# Patient Record
Sex: Female | Born: 1950 | Race: Black or African American | Hispanic: No | Smoking: Never smoker
Health system: Southern US, Community
[De-identification: ages and names within clinical notes are randomized; demographics above are authoritative.]

## PROBLEM LIST (undated history)

## (undated) DIAGNOSIS — E119 Type 2 diabetes mellitus without complications: Secondary | ICD-10-CM

## (undated) DIAGNOSIS — J329 Chronic sinusitis, unspecified: Secondary | ICD-10-CM

## (undated) DIAGNOSIS — E7800 Pure hypercholesterolemia, unspecified: Secondary | ICD-10-CM

## (undated) DIAGNOSIS — I10 Essential (primary) hypertension: Secondary | ICD-10-CM

## (undated) HISTORY — DX: Type 2 diabetes mellitus without complications: E11.9

## (undated) HISTORY — PX: JOINT REPLACEMENT: SHX530

---

## 1997-11-26 HISTORY — PX: ABDOMINAL HYSTERECTOMY: SHX81

## 2000-11-26 HISTORY — PX: SPINE SURGERY: SHX786

## 2007-06-20 DIAGNOSIS — Z96659 Presence of unspecified artificial knee joint: Secondary | ICD-10-CM | POA: Insufficient documentation

## 2012-02-05 LAB — HM COLONOSCOPY

## 2012-04-14 ENCOUNTER — Encounter (HOSPITAL_COMMUNITY): Payer: Self-pay | Admitting: Cardiology

## 2012-04-14 ENCOUNTER — Emergency Department (INDEPENDENT_AMBULATORY_CARE_PROVIDER_SITE_OTHER)
Admission: EM | Admit: 2012-04-14 | Discharge: 2012-04-14 | Disposition: A | Discharge status: Home / Self Care | Payer: Medicare Other | Source: Home / Self Care | Attending: Emergency Medicine | Admitting: Emergency Medicine

## 2012-04-14 DIAGNOSIS — R51 Headache: Secondary | ICD-10-CM

## 2012-04-14 DIAGNOSIS — J329 Chronic sinusitis, unspecified: Secondary | ICD-10-CM

## 2012-04-14 HISTORY — DX: Chronic sinusitis, unspecified: J32.9

## 2012-04-14 HISTORY — DX: Essential (primary) hypertension: I10

## 2012-04-14 HISTORY — DX: Pure hypercholesterolemia, unspecified: E78.00

## 2012-04-14 MED ORDER — GUAIFENESIN ER 600 MG PO TB12: 1200.0000 mg | ORAL_TABLET | Freq: Two times a day (BID) | ORAL | Status: AC

## 2012-04-14 MED ORDER — FLUTICASONE PROPIONATE 50 MCG/ACT NA SUSP: 2.0000 | Freq: Every day | NASAL | Status: DC

## 2012-04-14 MED ORDER — DOXYCYCLINE HYCLATE 100 MG PO CAPS: 100.0000 mg | ORAL_CAPSULE | Freq: Two times a day (BID) | ORAL | Status: AC

## 2012-04-14 MED ORDER — BUTALBITAL-APAP-CAFFEINE 50-325-40 MG PO TABS: 1.0000 | ORAL_TABLET | Freq: Four times a day (QID) | ORAL | Status: AC | PRN

## 2012-04-14 NOTE — Discharge Instructions (Signed)
Start using the Steinauer med sinus rinse, Neti pot, or another form of saline nasal irrigation to help clear your sinuses. You must drink extra fluids. Go to the ER if you do nto get better after being on the antibiotics for 48 hours, or immediately if your headache changes, gets worse, or any concerns.

## 2012-04-14 NOTE — ED Notes (Addendum)
Pt reports headache since May 4th with some easing off but now relief. Right eye redness for the past 3-4 months now has developed right eye pain. Pt has nasal buring and pressure. Denies fever. Pt states she blew nose the other day and she became "sick" (dizzy). Tolerating PO intake. Right hand tingling that started off/on since the headache started. Right leg also cramping and aching that is relieved somewhat by flexeril. Pt states when laying on right side there is a lot of pressure in her head and right eye. MAE without difficult.  Pt reports tiredness. Pt in town visiting and has a primary doctor in her home town Dr Kirtland Bouchard. Laural Benes

## 2012-04-14 NOTE — ED Provider Notes (Signed)
History     CSN: [id number]  Arrival date & time 04/14/12  4098   First MD Initiated Contact with Patient 04/14/12 1023      Chief Complaint  Patient presents with  . Headache  . Eye Pain  . Nose Problem    (Consider location/radiation/quality/duration/timing/severity/associated sxs/prior treatment) HPI Comments: Amy Mcgee is a 61 y.o. female who presents to Urgent Care today for gradual onset, intermittent, dull, throbbing, right-sided headache lasting hours starting "weeks ago". Reports a burning sensation in the sinuses, States that it feels that it is located "around my eye, "and she points to the frontal maxillary sinuses. She complains of intermittent periorbital swelling. Reports nasal congestion, but no nasal drainage. States it feels like "something is stuck in there."   States it started getting worse approximately 3 weeks ago. The headache is worse with bending forward and with lying down, especially on her right side. She has tried multiple OTC medications, and antihistamines without relief the. States BC powders seem to work best for her. denies foreign body insertion. No periorbital redness, eye pain, visual changes, pain with eye movement, photophobia. No nausea, vomiting, , phonophobia, fevers. no dental pain. No neck stiffness, rash. No dysarthria, discoordination, paresthesias. Patient has a history of chronic headaches, sinus infections, and has been evaluated by neurology and and ENT in the past. Per ENT, she has very narrow nasal passageways on the right side. She states that today's headache is similar to previous headaches, and is not worse than ones that she has had before. PMH includes hypertension, sinusitis. No history of strokes   ROS as noted in HPI. All other ROS negative.   Patient is a 61 y.o. female presenting with headaches. The history is provided by the patient. No language interpreter was used.  Headache The primary symptoms include headaches. The  symptoms began more than 1 week ago. The symptoms are unchanged. The neurological symptoms are focal.  The headache is not associated with photophobia, neck stiffness or weakness.  Additional symptoms do not include neck stiffness, weakness or photophobia. Medical issues also include hypertension. Medical issues do not include seizures, cerebral vascular accident, cancer, alcohol use, drug use or diabetes. Procedure history comments: neuro and ENT evaluation.    Past Medical History  Diagnosis Date  . Hypercholesterolemia   . Hypertension   . Sinusitis     Past Surgical History  Procedure Date  . Spine surgery 2002    cervical  . Joint replacement     right 2008 and left 2003  knee  . Abdominal hysterectomy 1999    History reviewed. No pertinent family history.  History  Substance Use Topics  . Smoking status: Never Smoker   . Smokeless tobacco: Not on file  . Alcohol Use: No    OB History    Grav Para Term Preterm Abortions TAB SAB Ect Mult Living                  Review of Systems  HENT: Negative for neck stiffness.   Eyes: Negative for photophobia.  Neurological: Positive for headaches. Negative for weakness.    Allergies  Lyrica  Home Medications   Current Outpatient Rx  Name Route Sig Dispense Refill  . CYCLOBENZAPRINE HCL 10 MG PO TABS Oral Take 10 mg by mouth 3 (three) times daily as needed.    . FUROSEMIDE 40 MG PO TABS Oral Take 40 mg by mouth as needed.    Marland Kitchen PRAVASTATIN SODIUM 40 MG PO TABS  Oral Take 40 mg by mouth daily.    Marland Kitchen SPIRONOLACTONE-HCTZ 25-25 MG PO TABS Oral Take 1 tablet by mouth daily.    Marland Kitchen BUTALBITAL-APAP-CAFFEINE 50-325-40 MG PO TABS Oral Take 1-2 tablets by mouth every 6 (six) hours as needed for headache. 20 tablet 0  . DIFLUNISAL 500 MG PO TABS Oral Take 500 mg by mouth 2 (two) times daily.    Marland Kitchen DOXYCYCLINE HYCLATE 100 MG PO CAPS Oral Take 1 capsule (100 mg total) by mouth 2 (two) times daily. X 7 days 14 capsule 0  . FLUTICASONE  PROPIONATE 50 MCG/ACT NA SUSP Nasal Place 2 sprays into the nose daily. 16 g 0  . GUAIFENESIN ER 600 MG PO TB12 Oral Take 2 tablets (1,200 mg total) by mouth 2 (two) times daily. 28 tablet 0    BP 143/93  Pulse 106  Temp(Src) 98.3 F (36.8 C) (Oral)  Resp 20  SpO2 96%  Physical Exam  Nursing note and vitals reviewed. Constitutional: She is oriented to person, place, and time. She appears well-developed and well-nourished. No distress.  HENT:  Head: Normocephalic and atraumatic. No trismus in the jaw.  Right Ear: Tympanic membrane normal.  Left Ear: Tympanic membrane normal.  Nose: Right sinus exhibits maxillary sinus tenderness and frontal sinus tenderness. Left sinus exhibits no maxillary sinus tenderness and no frontal sinus tenderness.  Mouth/Throat: Uvula is midline, oropharynx is clear and moist and mucous membranes are normal. No dental caries.       Purulent nasal drainage right side.   Eyes: Conjunctivae and EOM are normal. Pupils are equal, round, and reactive to light.  Fundoscopic exam:      The right eye shows no hemorrhage and no papilledema.       The left eye shows no hemorrhage and no papilledema.  Neck: Normal range of motion and full passive range of motion without pain. Neck supple. No Brudzinski's sign and no Kernig's sign noted.  Cardiovascular: Normal rate, regular rhythm and normal heart sounds.   Pulmonary/Chest: Effort normal and breath sounds normal.  Abdominal: Soft. Bowel sounds are normal. She exhibits no distension.  Musculoskeletal: Normal range of motion.  Lymphadenopathy:    She has no cervical adenopathy.  Neurological: She is alert and oriented to person, place, and time. She has normal Mcgee. She displays no tremor. No cranial nerve deficit or sensory deficit. She displays a negative Romberg sign. Coordination and gait normal.       Finger->nose, heel-> shin WNL. Tandem gait steady.   Skin: Skin is warm and dry.  Psychiatric: She has a normal  mood and affect. Her behavior is normal. Judgment and thought content normal.    ED Course  Procedures (including critical care time)  Labs Reviewed - No data to display No results found.   1. Sinus headache   2. Sinusitis     MDM  Pt describing typical pain, no sudden onset. Patient has a long history of similar headaches,Doubt SAH, ICH or space occupying lesion. Pt without fevers/chills, Pt has no meningeal sx, no nuchal rigidity. Doubt meningitis. Pt with normal neuro exam, no evidence of CVA/TIA.  Pt BP not elevated significantly, doubt hypertensive emergency. No evidence of temporal artery tenderness, no evidence of glaucoma or other occular pathology. . H&P most consistent with chronic sinusitis, sinus headache. Offered transfer to the ED CT of the head, but patient has opted to try outpatient treatment first. Will start her on doxycycline, Flonase, Mucinex, have her increase her fluid intake.  Also discontinue BC powders and will have her start Fioricet as needed for pain. Discussed signs and symptoms that should prompt return to the emergency department. Patient and  daughter agree.     Luiz Blare, MD 04/14/12 [phone]

## 2013-10-07 LAB — HM MAMMOGRAPHY

## 2014-02-01 ENCOUNTER — Telehealth: Payer: Self-pay

## 2014-02-01 NOTE — Telephone Encounter (Signed)
Unable to reach patient with number listed.  NEW PATIENT

## 2014-02-04 ENCOUNTER — Ambulatory Visit (INDEPENDENT_AMBULATORY_CARE_PROVIDER_SITE_OTHER): Payer: 59 | Admitting: Family Medicine

## 2014-02-04 ENCOUNTER — Encounter: Payer: Self-pay | Admitting: Family Medicine

## 2014-02-04 VITALS — BP 144/82 | HR 56 | Temp 98.2°F | Ht 62.0 in | Wt 299.8 lb

## 2014-02-04 DIAGNOSIS — E785 Hyperlipidemia, unspecified: Secondary | ICD-10-CM | POA: Insufficient documentation

## 2014-02-04 DIAGNOSIS — Z87898 Personal history of other specified conditions: Secondary | ICD-10-CM

## 2014-02-04 DIAGNOSIS — E669 Obesity, unspecified: Secondary | ICD-10-CM | POA: Insufficient documentation

## 2014-02-04 DIAGNOSIS — J45909 Unspecified asthma, uncomplicated: Secondary | ICD-10-CM

## 2014-02-04 DIAGNOSIS — F411 Generalized anxiety disorder: Secondary | ICD-10-CM

## 2014-02-04 DIAGNOSIS — R0609 Other forms of dyspnea: Secondary | ICD-10-CM

## 2014-02-04 DIAGNOSIS — R0989 Other specified symptoms and signs involving the circulatory and respiratory systems: Secondary | ICD-10-CM

## 2014-02-04 DIAGNOSIS — R0683 Snoring: Secondary | ICD-10-CM

## 2014-02-04 DIAGNOSIS — Z23 Encounter for immunization: Secondary | ICD-10-CM

## 2014-02-04 LAB — CBC WITH DIFFERENTIAL/PLATELET
BASOS PCT: 0.4 % (ref 0.0–3.0)
Basophils Absolute: 0 10*3/uL (ref 0.0–0.1)
EOS ABS: 0.1 10*3/uL (ref 0.0–0.7)
EOS PCT: 1.6 % (ref 0.0–5.0)
HCT: 40.9 % (ref 36.0–46.0)
HEMOGLOBIN: 13.4 g/dL (ref 12.0–15.0)
LYMPHS PCT: 41.8 % (ref 12.0–46.0)
Lymphs Abs: 1.8 10*3/uL (ref 0.7–4.0)
MCHC: 32.7 g/dL (ref 30.0–36.0)
MCV: 85.3 fl (ref 78.0–100.0)
Monocytes Absolute: 0.3 10*3/uL (ref 0.1–1.0)
Monocytes Relative: 7.6 % (ref 3.0–12.0)
NEUTROS ABS: 2.1 10*3/uL (ref 1.4–7.7)
Neutrophils Relative %: 48.6 % (ref 43.0–77.0)
Platelets: 217 10*3/uL (ref 150.0–400.0)
RBC: 4.79 Mil/uL (ref 3.87–5.11)
RDW: 14.5 % (ref 11.5–14.6)
WBC: 4.3 10*3/uL — AB (ref 4.5–10.5)

## 2014-02-04 LAB — BASIC METABOLIC PANEL
BUN: 10 mg/dL (ref 6–23)
CALCIUM: 9.6 mg/dL (ref 8.4–10.5)
CHLORIDE: 103 meq/L (ref 96–112)
CO2: 26 meq/L (ref 19–32)
Creatinine, Ser: 0.9 mg/dL (ref 0.4–1.2)
GFR: 84.53 mL/min (ref >60.00)
Glucose, Bld: 94 mg/dL (ref 70–99)
Potassium: 4.6 mEq/L (ref 3.5–5.1)
SODIUM: 138 meq/L (ref 135–145)

## 2014-02-04 LAB — TSH: TSH: 2.51 u[IU]/mL (ref 0.35–5.50)

## 2014-02-04 LAB — HEPATIC FUNCTION PANEL
ALT: 15 U/L (ref 0–35)
AST: 24 U/L (ref 0–37)
Albumin: 3.9 g/dL (ref 3.5–5.2)
Alkaline Phosphatase: 100 U/L (ref 39–117)
BILIRUBIN DIRECT: 0.2 mg/dL (ref 0.0–0.3)
BILIRUBIN TOTAL: 1.2 mg/dL (ref 0.3–1.2)
TOTAL PROTEIN: 7.4 g/dL (ref 6.0–8.3)

## 2014-02-04 LAB — SEDIMENTATION RATE: SED RATE: 31 mm/h — AB (ref 0–22)

## 2014-02-04 LAB — T4, FREE: Free T4: 0.92 ng/dL (ref 0.60–1.60)

## 2014-02-04 LAB — HEMOGLOBIN A1C: Hgb A1c MFr Bld: 7.2 % — ABNORMAL HIGH (ref 4.6–6.5)

## 2014-02-04 LAB — T3, FREE: T3 FREE: 2.4 pg/mL (ref 2.3–4.2)

## 2014-02-04 MED ORDER — ZOSTER VACCINE LIVE 19400 UNT/0.65ML ~~LOC~~ SOLR: 0.6500 mL | Freq: Once | SUBCUTANEOUS | Status: DC

## 2014-02-04 MED ORDER — CITALOPRAM HYDROBROMIDE 20 MG PO TABS: 20.0000 mg | ORAL_TABLET | Freq: Every day | ORAL | Status: DC

## 2014-02-04 MED ORDER — ALPRAZOLAM 0.25 MG PO TABS: 0.2500 mg | ORAL_TABLET | Freq: Three times a day (TID) | ORAL | Status: DC | PRN

## 2014-02-04 NOTE — Progress Notes (Signed)
Pre visit review using our clinic review tool, if applicable. No additional management support is needed unless otherwise documented below in the visit note. 

## 2014-02-04 NOTE — Progress Notes (Signed)
Patient ID: Amy Mcgee, female   DOB: [birth year], 63 y.o.   MRN: [id number]   Subjective:    Patient ID: Amy Mcgee, female    DOB: 1951-02-09, 63 y.o.   MRN: [id number] HPI Pt here with her daughter c/o swelling in face and sometimes difficulty swallowing as well as joint swelling / pains.  Pt is from [place] and has seen allergists, rheum and neuro there and no one has been able to figure out what is wrong.  Pt states it did start after a knee replacement several years ago.    Past Medical History  Diagnosis Date  . Hypercholesterolemia   . Hypertension   . Sinusitis    History reviewed. No pertinent family history. History   Social History  . Marital Status: Divorced    Spouse Name: N/A    Number of Children: N/A  . Years of Education: N/A   Occupational History  . Not on file.   Social History Main Topics  . Smoking status: Never Smoker   . Smokeless tobacco: Not on file  . Alcohol Use: No  . Drug Use: No  . Sexual Activity: No   Other Topics Concern  . Not on file   Social History Narrative   Lives in [place] but comes to [place] to live with her daughter for several weeks at a time   Current Outpatient Prescriptions on File Prior to Visit  Medication Sig Dispense Refill  . cyclobenzaprine (FLEXERIL) 10 MG tablet Take 10 mg by mouth 3 (three) times daily as needed.      . furosemide (LASIX) 40 MG tablet Take 40 mg by mouth as needed.      . pravastatin (PRAVACHOL) 40 MG tablet Take 40 mg by mouth daily.       No current facility-administered medications on file prior to visit.   Past Surgical History  Procedure Laterality Date  . Spine surgery  2002    cervical  . Joint replacement      right 2008 and left 2003  knee  . Abdominal hysterectomy  1999    .       Objective:    BP 144/82  Pulse 56  Temp(Src) 98.2 F (36.8 C) (Oral)  Ht _0  (1.575 m)  Wt 299 lb 12.8 oz (135.988 kg)  BMI 54.82 kg/m2  SpO2 98% General appearance: alert,  cooperative, appears stated age and no distress Eyes: conjunctivae/corneas clear. PERRL, EOM's intact. Fundi benign.-- eyelids swollen Ears: normal TM's and external ear canals both ears Nose: Nares normal. Septum midline. Mucosa normal. No drainage or sinus tenderness. Throat: lips, mucosa, and tongue normal; teeth and gums normal Neck: no adenopathy, supple, symmetrical, trachea midline and thyroid not enlarged, symmetric, no tenderness/mass/nodules Lungs: clear to auscultation bilaterally Heart: S1, S2 normal Extremities: extremities normal, atraumatic, no cyanosis or edema Lymph nodes: Cervical, supraclavicular, and axillary nodes normal. Neurologic: Alert and oriented X 3, normal strength and tone. Normal symmetric reflexes. Normal coordination and gait-- walks with cane        Assessment & Plan:  1. H/O angioedema ? Etiology-- ?HAE - Ambulatory referral to Pulmonology - Basic metabolic panel - CBC with Differential - Hemoglobin A1c - Hepatic function panel - POCT urinalysis dipstick - TSH - Vitamin D 1,25 dihydroxy - Antinuclear Antib (ANA) - Rheumatoid Factor - Sed Rate (ESR) - Aspergillus antibody (complement fix) - C4 complement - C1 Esterase Inhibitor Panel - Complement component c1q - T3, free - T4, free  2. Snoring ?  OSA - Ambulatory referral to Pulmonology - Basic metabolic panel - CBC with Differential - Hemoglobin A1c - Hepatic function panel - POCT urinalysis dipstick - TSH - Vitamin D 1,25 dihydroxy - Antinuclear Antib (ANA) - Rheumatoid Factor - Sed Rate (ESR)  3. Unspecified asthma(493.90) con't inhalers prn  4. Other and unspecified hyperlipidemia   5. Anxiety state, unspecified Start meds--rto 1 month - citalopram (CELEXA) 20 MG tablet; Take 1 tablet (20 mg total) by mouth daily.  Dispense: 30 tablet; Refill: 2  6. Need for shingles vaccine rx printed - zoster vaccine live, PF, (ZOSTAVAX) 14709 UNT/0.65ML injection; Inject 19,400  Units into the skin once.  Dispense: 1 each; Refill: 0

## 2014-02-04 NOTE — Patient Instructions (Signed)
Angioedema Angioedema is a sudden swelling of tissues, often of the skin. It can occur on the face or genitals or in the abdomen or other body parts. The swelling usually develops over a short period and gets better in 24 to 48 hours. It often begins during the night and is found when the person wakes up. The person may also get red, itchy patches of skin (hives). Angioedema can be dangerous if it involves swelling of the air passages.  Depending on the cause, episodes of angioedema may only happen once, come back in unpredictable patterns, or repeat for several years and then gradually fade away.  CAUSES  Angioedema can be caused by an allergic reaction to various triggers. It can also result from nonallergic causes, including reactions to drugs, immune system disorders, viral infections, or an abnormal gene that is passed to you from your parents (hereditary). For some people with angioedema, the cause is unknown.  Some things that can trigger angioedema include:   Foods.   Medicines, such as ACE inhibitors, ARBs, nonsteroidal anti-inflammatory agents, or estrogen.   Latex.   Animal saliva.   Insect stings.   Dyes used in X-rays.   Mild injury.   Dental work.  Surgery.  Stress.   Sudden changes in temperature.   Exercise. SIGNS AND SYMPTOMS   Swelling of the skin.  Hives. If these are present, there is also intense itching.  Redness in the affected area.   Pain in the affected area.  Swollen lips or tongue.  Breathing problems. This may happen if the air passages swell.  Wheezing. If internal organs are involved, there may be:   Nausea.   Abdominal pain.   Vomiting.   Difficulty swallowing.   Difficulty passing urine. DIAGNOSIS   Your health care provider will examine the affected area and take a medical and family history.  Various tests may be done to help determine the cause. Tests may include:  Allergy skin tests to see if the problem  is an allergic reaction.   Blood tests to check for hereditary angioedema.   Tests to check for underlying diseases that could cause the condition.   A review of your medicines, including over the counter medicines, may be done. TREATMENT  Treatment will depend on the cause of the angioedema. Possible treatments include:   Removal of anything that triggered the condition (such as stopping certain medicines).   Medicines to treat symptoms or prevent attacks. Medicines given may include:   Antihistamines.   Epinephrine injection.   Steroids.   Hospitalization may be required for severe attacks. If the air passages are affected, it can be an emergency. Tubes may need to be placed to keep the airway open. HOME CARE INSTRUCTIONS   Only take over-the-counter or prescription medicines as directed by your health care provider.  If you were given medicines for emergency allergy treatment, always carry them with you.  Wear a medical bracelet as directed by your health care provider.   Avoid known triggers. SEEK MEDICAL CARE IF:   You have repeat attacks of angioedema.   Your attacks are more frequent or more severe despite preventive measures.   You have hereditary angioedema and are considering having children. It is important to discuss the risks of passing the condition on to your children with your health care provider. SEEK IMMEDIATE MEDICAL CARE IF:   You have severe swelling of the mouth, tongue, or lips.  You have difficulty breathing.   You have difficulty swallowing.     You faint. MAKE SURE YOU:  Understand these instructions.  Will watch your condition.  Will get help right away if you are not doing well or get worse. Document Released: 01/21/2002 Document Revised: 09/02/2013 Document Reviewed: 07/06/2013 Exit[employer] Patient Information 2014 [employer].  

## 2014-02-05 LAB — POCT URINALYSIS DIPSTICK
Bilirubin, UA: NEGATIVE
Glucose, UA: NEGATIVE
KETONES UA: NEGATIVE
Leukocytes, UA: NEGATIVE
Nitrite, UA: NEGATIVE
PH UA: 6
PROTEIN UA: NEGATIVE
RBC UA: NEGATIVE
UROBILINOGEN UA: 0.2

## 2014-02-05 LAB — C4 COMPLEMENT: C4 COMPLEMENT: 33 mg/dL (ref 10–40)

## 2014-02-05 LAB — ANA: ANA: NEGATIVE

## 2014-02-05 LAB — RHEUMATOID FACTOR

## 2014-02-05 MED ORDER — METFORMIN HCL 500 MG PO TABS: 500.0000 mg | ORAL_TABLET | Freq: Two times a day (BID) | ORAL | Status: DC

## 2014-02-06 LAB — C1 ESTERASE INHIBITOR: C1INH SerPl-mCnc: 27 mg/dL (ref 21–39)

## 2014-02-10 LAB — ASPERGILLUS ANTIBODY (COMPLEMENT FIX)

## 2014-02-10 NOTE — Telephone Encounter (Signed)
Unable to reach patient pre visit.  

## 2014-02-11 ENCOUNTER — Ambulatory Visit: Payer: 59

## 2014-02-11 DIAGNOSIS — E119 Type 2 diabetes mellitus without complications: Secondary | ICD-10-CM

## 2014-02-11 DIAGNOSIS — Z7189 Other specified counseling: Secondary | ICD-10-CM

## 2014-02-11 MED ORDER — ACCU-CHEK FASTCLIX LANCETS MISC: Status: DC

## 2014-02-11 MED ORDER — ACCU-CHEK AVIVA DEVI: Status: AC

## 2014-02-11 MED ORDER — GLUCOSE BLOOD VI STRP: ORAL_STRIP | Status: DC

## 2014-02-11 NOTE — Progress Notes (Unsigned)
Diabetes education was provided.  Daughter was present during the visit.  The following topics were reviewed:  Medication management (Metformin 500 mg twice daily with meals), blood glucose monitoring (how to monitor blood sugars and the green, yellow, red zones), signs and symptoms of hyperglycemia, signs and symptoms of hypoglycemia, healthy eating (how to read food labels, portion sizes, salt intake and carb counting), and healthy exercise options (walking, bicycling, water aerobics).  Over the next month, the patient stated that she wanted to work on improving her diet and exercise.  Healthy eating materials were provided to the patient.  Patient was also encouraged to increase her exercise by walking or doing water aerobics due to the problems with her knees. Daughter and mother discussed options that are available to patient in [place], [place].  The teach back method was used throughout the education session to verify patient's understanding of the topic   Prescription for accu-chek device, strips, and lancets were [facility] in [place].  Patient made aware.

## 2014-02-12 LAB — COMPLEMENT COMPONENT C1Q: Complement C1Q: 7.5 mg/dL (ref 5.0–8.6)

## 2014-02-13 LAB — VITAMIN D 1,25 DIHYDROXY
VITAMIN D2 1, 25 (OH): 20 pg/mL
Vitamin D 1, 25 (OH)2 Total: 32 pg/mL (ref 18–72)
Vitamin D3 1, 25 (OH)2: 12 pg/mL

## 2014-02-16 ENCOUNTER — Other Ambulatory Visit: Payer: Self-pay

## 2014-02-16 MED ORDER — VITAMIN D (ERGOCALCIFEROL) 1.25 MG (50000 UNIT) PO CAPS: 50000.0000 [IU] | ORAL_CAPSULE | ORAL | Status: DC

## 2014-03-09 ENCOUNTER — Encounter: Payer: Self-pay | Admitting: Internal Medicine

## 2014-03-09 ENCOUNTER — Ambulatory Visit (INDEPENDENT_AMBULATORY_CARE_PROVIDER_SITE_OTHER): Payer: Medicare Other | Admitting: Internal Medicine

## 2014-03-09 ENCOUNTER — Other Ambulatory Visit: Payer: Medicare Other

## 2014-03-09 ENCOUNTER — Ambulatory Visit (INDEPENDENT_AMBULATORY_CARE_PROVIDER_SITE_OTHER): Payer: Medicare Other | Admitting: Family Medicine

## 2014-03-09 ENCOUNTER — Encounter: Payer: Self-pay | Admitting: Family Medicine

## 2014-03-09 VITALS — BP 114/82 | HR 65 | Temp 98.1°F | Wt 295.4 lb

## 2014-03-09 VITALS — BP 122/70 | HR 74 | Ht 62.0 in | Wt 298.0 lb

## 2014-03-09 DIAGNOSIS — J310 Chronic rhinitis: Secondary | ICD-10-CM

## 2014-03-09 DIAGNOSIS — R600 Localized edema: Secondary | ICD-10-CM

## 2014-03-09 DIAGNOSIS — E1151 Type 2 diabetes mellitus with diabetic peripheral angiopathy without gangrene: Secondary | ICD-10-CM

## 2014-03-09 DIAGNOSIS — I10 Essential (primary) hypertension: Secondary | ICD-10-CM

## 2014-03-09 DIAGNOSIS — J309 Allergic rhinitis, unspecified: Secondary | ICD-10-CM

## 2014-03-09 DIAGNOSIS — R0902 Hypoxemia: Secondary | ICD-10-CM

## 2014-03-09 DIAGNOSIS — E1159 Type 2 diabetes mellitus with other circulatory complications: Secondary | ICD-10-CM

## 2014-03-09 DIAGNOSIS — R609 Edema, unspecified: Secondary | ICD-10-CM

## 2014-03-09 DIAGNOSIS — F411 Generalized anxiety disorder: Secondary | ICD-10-CM

## 2014-03-09 DIAGNOSIS — E785 Hyperlipidemia, unspecified: Secondary | ICD-10-CM

## 2014-03-09 DIAGNOSIS — J45909 Unspecified asthma, uncomplicated: Secondary | ICD-10-CM

## 2014-03-09 DIAGNOSIS — G4733 Obstructive sleep apnea (adult) (pediatric): Secondary | ICD-10-CM

## 2014-03-09 DIAGNOSIS — I798 Other disorders of arteries, arterioles and capillaries in diseases classified elsewhere: Secondary | ICD-10-CM

## 2014-03-09 MED ORDER — ALBUTEROL SULFATE HFA 108 (90 BASE) MCG/ACT IN AERS: 2.0000 | INHALATION_SPRAY | Freq: Four times a day (QID) | RESPIRATORY_TRACT | Status: DC | PRN

## 2014-03-09 MED ORDER — SPIRONOLACTONE 50 MG PO TABS: 50.0000 mg | ORAL_TABLET | Freq: Every day | ORAL | Status: DC

## 2014-03-09 MED ORDER — CITALOPRAM HYDROBROMIDE 40 MG PO TABS: 40.0000 mg | ORAL_TABLET | Freq: Every day | ORAL | Status: DC

## 2014-03-09 MED ORDER — PRAVASTATIN SODIUM 40 MG PO TABS: 40.0000 mg | ORAL_TABLET | Freq: Every day | ORAL | Status: DC

## 2014-03-09 MED ORDER — METFORMIN HCL 500 MG PO TABS: 500.0000 mg | ORAL_TABLET | Freq: Two times a day (BID) | ORAL | Status: DC

## 2014-03-09 MED ORDER — AMLODIPINE BESYLATE 5 MG PO TABS: 5.0000 mg | ORAL_TABLET | Freq: Every day | ORAL | Status: DC

## 2014-03-09 NOTE — Progress Notes (Signed)
Pre visit review using our clinic review tool, if applicable. No additional management support is needed unless otherwise documented below in the visit note. 

## 2014-03-09 NOTE — Progress Notes (Signed)
03/09/14- [age] yoF never smoker Dr Laury AxonLowne- concerned about sleep apnea and facial swelling. NPSG 05/11/12-[facility]-  Mild obstructive sleep apnea, AHI 14.9 per hour with body weight 288 pounds.PLMI 5.3/ hr. CPAP titration 06/03/12 to 11. She reports snoring for years. Sleeps in a recliner. Bedtime between 1 AM and 3 AM "could stay up all night", getting up 4 times before up in the morning between 10 AM and 11 AM. Says she has had 3 sleep studies but "never needed CPAP".  Lying down, feels stuffy in the nose. For 7 years has been experiencing periorbital edema. Had facial rash with negative workup for lupus. Gets sinus congestion treated with nose sprays, daily frontal headaches, joint pains and muscle spasms. Skin testing a year ago elsewhere reported negative. No ENT surgery. Facial swelling but better on a prednisone taper. Denies tongue swelling, hives or asthma. Labs 02/04/2014-sedimentation rate 31, ANA negative, rheumatoid factor normal, C4 complement and C1esterace negative. Divorced, living alone.  Prior to Admission medications   Medication Sig Start Date End Date Taking? Authorizing Provider  ACCU-CHEK FASTCLIX LANCETS MISC Check blood sugar once a day 02/11/14   Lelon PerlaYvonne R Lowne, DO  albuterol (PROAIR HFA) 108 (90 BASE) MCG/ACT inhaler Inhale 2 puffs into the lungs every 6 (six) hours as needed for wheezing or shortness of breath. 03/09/14   Lelon PerlaYvonne R Lowne, DO  ALPRAZolam (XANAX) 0.25 MG tablet Take 1 tablet (0.25 mg total) by mouth 3 (three) times daily as needed for anxiety or sleep. 02/04/14   Grayling CongressYvonne R Lowne, DO  amLODipine (NORVASC) 5 MG tablet Take 1 tablet (5 mg total) by mouth daily. 03/09/14   Lelon PerlaYvonne R Lowne, DO  Blood Glucose Monitoring Suppl (ACCU-CHEK AVIVA) device Use as instructed 02/11/14 02/11/15  Lelon PerlaYvonne R Lowne, DO  cetirizine (ZYRTEC) 10 MG tablet Take 10 mg by mouth daily.    Historical Provider, MD  cholecalciferol (VITAMIN D) 1000 UNITS tablet Take 1,000 Units by mouth daily.     Historical Provider, MD  citalopram (CELEXA) 40 MG tablet Take 1 tablet (40 mg total) by mouth daily. 03/09/14   Lelon PerlaYvonne R Lowne, DO  cyclobenzaprine (FLEXERIL) 10 MG tablet Take 10 mg by mouth 3 (three) times daily as needed.    Historical Provider, MD  furosemide (LASIX) 40 MG tablet Take 40 mg by mouth as needed.    Historical Provider, MD  glucose blood (ACCU-CHEK AVIVA PLUS) test strip Use as instructed 02/11/14   Lelon PerlaYvonne R Lowne, DO  ipratropium-albuterol (DUONEB) 0.5-2.5 (3) MG/3ML SOLN Take 3 mLs by nebulization every 6 (six) hours as needed.    Historical Provider, MD  metFORMIN (GLUCOPHAGE) 500 MG tablet Take 1 tablet (500 mg total) by mouth 2 (two) times daily with a meal. 03/09/14   Lelon PerlaYvonne R Lowne, DO  potassium chloride SA (K-DUR,KLOR-CON) 20 MEQ tablet Take 40 mEq by mouth once.    Historical Provider, MD  pravastatin (PRAVACHOL) 40 MG tablet Take 1 tablet (40 mg total) by mouth daily. 03/09/14   Lelon PerlaYvonne R Lowne, DO  spironolactone (ALDACTONE) 50 MG tablet Take 1 tablet (50 mg total) by mouth daily. 03/09/14   Lelon PerlaYvonne R Lowne, DO  Vitamin D, Ergocalciferol, (DRISDOL) 50000 UNITS CAPS capsule Take 1 capsule (50,000 Units total) by mouth every 7 (seven) days. 02/16/14   Lelon PerlaYvonne R Lowne, DO   Past Medical History  Diagnosis Date  . Hypercholesterolemia   . Hypertension   . Sinusitis    Past Surgical History  Procedure Laterality Date  .  Spine surgery  2002    cervical  . Joint replacement      right 2008 and left 2003  knee  . Abdominal hysterectomy  1999   Family History  Problem Relation Age of Onset  . Allergies Mother   . Allergies Maternal Aunt   . Allergies Brother   . Allergies Brother   . Allergies Daughter   . Stroke Mother    History   Social History  . Marital Status: Divorced    Spouse Name: N/A    Number of Children: N/A  . Years of Education: N/A   Occupational History  . retired    Social History Main Topics  . Smoking status: Never Smoker   . Smokeless  tobacco: Not on file  . Alcohol Use: No  . Drug Use: No  . Sexual Activity: No   Other Topics Concern  . Not on file   Social History Narrative   Lives in [place] but comes to [place] to live with her daughter for several weeks at a time   ROS-see HPI Constitutional:   No-   weight loss, night sweats, fevers, chills, fatigue, lassitude. HEENT:   + headaches, no-difficulty swallowing, tooth/dental problems, sore throat,       No-  sneezing, itching, ear ache, +nasal congestion, post nasal drip,  CV:  No-   chest pain, orthopnea, PND, swelling in lower extremities, anasarca,                                  dizziness, palpitations Resp: No-   shortness of breath with exertion or at rest.              No-   productive cough,  No non-productive cough,  No- coughing up of blood.              No-   change in color of mucus.  No- wheezing.   Skin: No-   rash or lesions. GI:  No-   heartburn, indigestion, abdominal pain, nausea, vomiting, diarrhea,                 change in bowel habits, loss of appetite GU: No-   dysuria, change in color of urine, no urgency or frequency.  No- flank pain. MS:  No-   joint pain or swelling.  No- decreased range of motion.  No- back pain. Neuro-     nothing unusual Psych:  No- change in mood or affect. No depression or anxiety.  No memory loss.  OBJ- Physical Exam General- Alert, Oriented, Affect-appropriate, Distress- none acute,+ overweight Skin- rash-none, lesions- none, excoriation- none Lymphadenopathy- none Head- atraumatic            Eyes- Gross vision intact, PERRLA, conjunctivae and secretions clear, + periorbital                                edema            Ears- Hearing, canals-normal            Nose- Clear, no-Septal dev, mucus, polyps, erosion, perforation             Throat- Mallampati II , mucosa clear , drainage- none, tonsils+ present Neck- flexible , trachea midline, no stridor , thyroid nl, carotid no bruit Chest - symmetrical  excursion , unlabored  Heart/CV- RRR , no murmur , no gallop  , no rub, nl s1 s2                           - JVD- none , edema- none, stasis changes- none, varices- none           Lung- clear to P&A, wheeze- none, cough- none , dullness-none, rub- none           Chest wall-  Abd- tender-no, distended-no, bowel sounds-present, HSM- no Br/ Gen/ Rectal- Not done, not indicated Extrem- cyanosis- none, clubbing, none, atrophy- none, strength- nl Neuro- grossly intact to observation

## 2014-03-09 NOTE — Patient Instructions (Signed)

## 2014-03-09 NOTE — Patient Instructions (Addendum)
Order- CT max fac, with/ without contrast  Dx Chronic facial edema, question sinusitis  Order- DME  ONOX room air     Dx hypoxia  Order- lab- Allergy profile    Dx allergic rhinitis

## 2014-03-09 NOTE — Progress Notes (Signed)
Subjective:    Patient ID: Amy Mcgee, female    DOB: 1951-02-27, 63 y.o.   MRN: [id number]  HPI     Review of Systems    as above Objective:   Physical Exam      Assessment & Plan:   Subjective:    Amy Mcgee is a 63 y.o. female who presents for Medicare Annual wellness   HPI HYPERTENSION  Blood pressure range-good  Chest pain- no      Dyspnea- no Lightheadedness- no   Edema- no change Other side effects - no   Medication compliance: good Low salt diet- yes  DIABETES  Blood Sugar ranges-good  Polyuria- no New Visual problems- no Hypoglycemic symptoms- no Other side effects-no Medication compliance - good Last eye exam- due Foot exam- today  HYPERLIPIDEMIA  Medication compliance- good RUQ pain- no  Muscle aches- no Other side effects-no       Preventive Screening-Counseling & Management  Tobacco History  Smoking status  . Never Smoker   Smokeless tobacco  . Not on file     Problems Prior to Visit 1. Inc anxiety  Current Problems (verified) Patient Active Problem List   Diagnosis Date Noted  . Unspecified asthma(493.90) 02/04/2014  . Other and unspecified hyperlipidemia 02/04/2014  . Obesity (BMI 30-39.9) 02/04/2014    Medications Prior to Visit Current Outpatient Prescriptions on File Prior to Visit  Medication Sig Dispense Refill  . ACCU-CHEK FASTCLIX LANCETS MISC Check blood sugar once a day  102 each  12  . ALPRAZolam (XANAX) 0.25 MG tablet Take 1 tablet (0.25 mg total) by mouth 3 (three) times daily as needed for anxiety or sleep.  30 tablet  1  . Blood Glucose Monitoring Suppl (ACCU-CHEK AVIVA) device Use as instructed  1 each  0  . cetirizine (ZYRTEC) 10 MG tablet Take 10 mg by mouth daily.      . cholecalciferol (VITAMIN D) 1000 UNITS tablet Take 1,000 Units by mouth daily.      . cyclobenzaprine (FLEXERIL) 10 MG tablet Take 10 mg by mouth 3 (three) times daily as needed.      . furosemide (LASIX) 40 MG tablet Take 40  mg by mouth as needed.      Marland Kitchen glucose blood (ACCU-CHEK AVIVA PLUS) test strip Use as instructed  100 each  12  . ipratropium-albuterol (DUONEB) 0.5-2.5 (3) MG/3ML SOLN Take 3 mLs by nebulization every 6 (six) hours as needed.      . potassium chloride SA (K-DUR,KLOR-CON) 20 MEQ tablet Take 40 mEq by mouth once.      . Vitamin D, Ergocalciferol, (DRISDOL) 50000 UNITS CAPS capsule Take 1 capsule (50,000 Units total) by mouth every 7 (seven) days.  4 capsule  2   No current facility-administered medications on file prior to visit.    Current Medications (verified) Current Outpatient Prescriptions  Medication Sig Dispense Refill  . ACCU-CHEK FASTCLIX LANCETS MISC Check blood sugar once a day  102 each  12  . ALPRAZolam (XANAX) 0.25 MG tablet Take 1 tablet (0.25 mg total) by mouth 3 (three) times daily as needed for anxiety or sleep.  30 tablet  1  . amLODipine (NORVASC) 5 MG tablet Take 1 tablet (5 mg total) by mouth daily.  90 tablet  1  . Blood Glucose Monitoring Suppl (ACCU-CHEK AVIVA) device Use as instructed  1 each  0  . cetirizine (ZYRTEC) 10 MG tablet Take 10 mg by mouth daily.      . cholecalciferol (  VITAMIN D) 1000 UNITS tablet Take 1,000 Units by mouth daily.      . cyclobenzaprine (FLEXERIL) 10 MG tablet Take 10 mg by mouth 3 (three) times daily as needed.      . furosemide (LASIX) 40 MG tablet Take 40 mg by mouth as needed.      Marland Kitchen. glucose blood (ACCU-CHEK AVIVA PLUS) test strip Use as instructed  100 each  12  . ipratropium-albuterol (DUONEB) 0.5-2.5 (3) MG/3ML SOLN Take 3 mLs by nebulization every 6 (six) hours as needed.      . metFORMIN (GLUCOPHAGE) 500 MG tablet Take 1 tablet (500 mg total) by mouth 2 (two) times daily with a meal.  180 tablet  1  . potassium chloride SA (K-DUR,KLOR-CON) 20 MEQ tablet Take 40 mEq by mouth once.      . pravastatin (PRAVACHOL) 40 MG tablet Take 1 tablet (40 mg total) by mouth daily.  90 tablet  1  . spironolactone (ALDACTONE) 50 MG tablet Take 1  tablet (50 mg total) by mouth daily.  90 tablet  1  . Vitamin D, Ergocalciferol, (DRISDOL) 50000 UNITS CAPS capsule Take 1 capsule (50,000 Units total) by mouth every 7 (seven) days.  4 capsule  2  . albuterol (PROAIR HFA) 108 (90 BASE) MCG/ACT inhaler Inhale 2 puffs into the lungs every 6 (six) hours as needed for wheezing or shortness of breath.  1 Inhaler  3  . citalopram (CELEXA) 40 MG tablet Take 1 tablet (40 mg total) by mouth daily.  90 tablet  1   No current facility-administered medications for this visit.     Allergies (verified) Lyrica   PAST HISTORY  Family History No family history on file.  Social History History  Substance Use Topics  . Smoking status: Never Smoker   . Smokeless tobacco: Not on file  . Alcohol Use: No     Are there smokers in your home (other than you)? No  Risk Factors Current exercise habits: The patient does not participate in regular exercise at present.  Dietary issues discussed: diabetic diet   Cardiac risk factors: diabetes mellitus, dyslipidemia, hypertension, obesity (BMI >= 30 kg/m2) and sedentary lifestyle.  Depression Screen (Note: if answer to either of the following is "Yes", a more complete depression screening is indicated)   Over the past two weeks, have you felt down, depressed or hopeless? No  Over the past two weeks, have you felt little interest or pleasure in doing things? No  Have you lost interest or pleasure in daily life? No  Do you often feel hopeless? No  Do you cry easily over simple problems? No  Activities of Daily Living In your present state of health, do you have any difficulty performing the following activities?:  Driving? No Managing money?  No Feeding yourself? No Getting from bed to chair? No Climbing a flight of stairs? No Preparing food and eating?: No Bathing or showering? No Getting dressed: No Getting to the toilet? No Using the toilet:No Moving around from place to place: No In the past  year have you fallen or had a near fall?:Yes--- pt didn't see something on floor in [employer]   Are you sexually active?  No  Do you have more than one partner?  No  Hearing Difficulties: No Do you often ask people to speak up or repeat themselves? No Do you experience ringing or noises in your ears? No Do you have difficulty understanding soft or whispered voices? No   Do you  feel that you have a problem with memory? No  Do you often misplace items? No  Do you feel safe at home?  Yes  Cognitive Testing  Alert? Yes  Normal Appearance?Yes  Oriented to person? Yes  Place? Yes   Time? Yes  Recall of three objects?  Yes  Can perform simple calculations? Yes  Displays appropriate judgment?Yes  Can read the correct time from a watch face?Yes   Advanced Directives have been discussed with the patient? Yes  List the Names of Other Physician/Practitioners you currently use: 1.  Young--allergy  Indicate any recent Medical Services you may have received from other than [facility] providers in the past year (date may be approximate).  Immunization History  Administered Date(s) Administered  . Zoster 02/04/2014    Screening Tests Health Maintenance  Topic Date Due  . Pap Smear  12/05/1968  . Tetanus/tdap  12/05/1969  . Influenza Vaccine  06/26/2014  . Mammogram  10/08/2015  . Colonoscopy  02/04/2022  . Zostavax  Completed    All answers were reviewed with the patient and necessary referrals were made:  Amy FreudYvonne Lowne, DO   03/09/2014   History reviewed:  She  has a past medical history of Hypercholesterolemia; Hypertension; and Sinusitis. She  does not have any pertinent problems on file. She  has past surgical history that includes Spine surgery (2002); Joint replacement; and Abdominal hysterectomy (1999). Her family history is not on file. She  reports that she has never smoked. She does not have any smokeless tobacco history on file. She reports that she does not drink alcohol or  use illicit drugs. She has a current medication list which includes the following prescription(s): accu-chek fastclix lancets, alprazolam, amlodipine, accu-chek aviva, cetirizine, cholecalciferol, cyclobenzaprine, furosemide, glucose blood, ipratropium-albuterol, metformin, potassium chloride sa, pravastatin, spironolactone, vitamin d (ergocalciferol), albuterol, and citalopram. Current Outpatient Prescriptions on File Prior to Visit  Medication Sig Dispense Refill  . ACCU-CHEK FASTCLIX LANCETS MISC Check blood sugar once a day  102 each  12  . ALPRAZolam (XANAX) 0.25 MG tablet Take 1 tablet (0.25 mg total) by mouth 3 (three) times daily as needed for anxiety or sleep.  30 tablet  1  . Blood Glucose Monitoring Suppl (ACCU-CHEK AVIVA) device Use as instructed  1 each  0  . cetirizine (ZYRTEC) 10 MG tablet Take 10 mg by mouth daily.      . cholecalciferol (VITAMIN D) 1000 UNITS tablet Take 1,000 Units by mouth daily.      . cyclobenzaprine (FLEXERIL) 10 MG tablet Take 10 mg by mouth 3 (three) times daily as needed.      . furosemide (LASIX) 40 MG tablet Take 40 mg by mouth as needed.      Marland Kitchen. glucose blood (ACCU-CHEK AVIVA PLUS) test strip Use as instructed  100 each  12  . ipratropium-albuterol (DUONEB) 0.5-2.5 (3) MG/3ML SOLN Take 3 mLs by nebulization every 6 (six) hours as needed.      . potassium chloride SA (K-DUR,KLOR-CON) 20 MEQ tablet Take 40 mEq by mouth once.      . Vitamin D, Ergocalciferol, (DRISDOL) 50000 UNITS CAPS capsule Take 1 capsule (50,000 Units total) by mouth every 7 (seven) days.  4 capsule  2   No current facility-administered medications on file prior to visit.   She is allergic to lyrica.  Review of Systems Pertinent items are noted in HPI.    Objective:     Vision by Snellen chart: opth  Body mass index is 54.02  kg/(m^2). BP 114/82  Pulse 65  Temp(Src) 98.1 F (36.7 C) (Oral)  Wt 295 lb 6.4 oz (133.993 kg)  SpO2 97%  BP 114/82  Pulse 65  Temp(Src) 98.1 F  (36.7 C) (Oral)  Wt 295 lb 6.4 oz (133.993 kg)  SpO2 97% General appearance: alert, cooperative, appears stated age and no distress Throat: lips, mucosa, and tongue normal; teeth and gums normal Neck: no adenopathy, supple, symmetrical, trachea midline and thyroid not enlarged, symmetric, no tenderness/mass/nodules Lungs: clear to auscultation bilaterally Abdomen: soft, non-tender; bowel sounds normal; no masses,  no organomegaly Extremities: extremities normal, atraumatic, no cyanosis or edema   Sensory exam of the foot is normal, tested with the monofilament. Good pulses, no lesions or ulcers, good peripheral pulses.   Assessment:    medicare wellness      Plan:     During the course of the visit the patient was educated and counseled about appropriate screening and preventive services including:    Pneumococcal vaccine   Screening mammography  Bone densitometry screening  Diabetes screening  Glaucoma screening  Advanced directives: has NO advanced directive  - add't info requested. Referral to SW: no  Diet review for nutrition referral? Yes ____  Not Indicated ___x_   Patient Instructions (the written plan) was given to the patient.  Medicare Attestation I have personally reviewed: The patient's medical and social history Their use of alcohol, tobacco or illicit drugs Their current medications and supplements The patient's functional ability including ADLs,fall risks, home safety risks, cognitive, and hearing and visual impairment Diet and physical activities Evidence for depression or mood disorders  The patient's weight, height, BMI, and visual acuity have been recorded in the chart.  I have made referrals, counseling, and provided education to the patient based on review of the above and I have provided the patient with a written personalized care plan for preventive services.    1. Unspecified asthma(493.90)  - albuterol (PROAIR HFA) 108 (90 BASE) MCG/ACT  inhaler; Inhale 2 puffs into the lungs every 6 (six) hours as needed for wheezing or shortness of breath.  Dispense: 1 Inhaler; Refill: 3  2. HTN (hypertension) stable - spironolactone (ALDACTONE) 50 MG tablet; Take 1 tablet (50 mg total) by mouth daily.  Dispense: 90 tablet; Refill: 1 - amLODipine (NORVASC) 5 MG tablet; Take 1 tablet (5 mg total) by mouth daily.  Dispense: 90 tablet; Refill: 1  3. DM (diabetes mellitus) type II controlled peripheral vascular disorder Stable-- con't meds - metFORMIN (GLUCOPHAGE) 500 MG tablet; Take 1 tablet (500 mg total) by mouth 2 (two) times daily with a meal.  Dispense: 180 tablet; Refill: 1  4. Other and unspecified hyperlipidemia  - pravastatin (PRAVACHOL) 40 MG tablet; Take 1 tablet (40 mg total) by mouth daily.  Dispense: 90 tablet; Refill: 1  5. Generalized anxiety disorder Inc celexa to 40 mg ---f/u June - citalopram (CELEXA) 40 MG tablet; Take 1 tablet (40 mg total) by mouth daily.  Dispense: 90 tablet; Refill: 1   Amy Freud, DO   03/09/2014

## 2014-03-10 LAB — ALLERGY FULL PROFILE
Alternaria Alternata: <0.1 kU/L
BOX ELDER: 0.77 kU/L — AB
Bahia Grass: <0.1 kU/L
Bermuda Grass: <0.1 kU/L
Candida Albicans: <0.1 kU/L
Cat Dander: <0.1 kU/L
Curvularia lunata: <0.1 kU/L
Dog Dander: <0.1 kU/L
Fescue: <0.1 kU/L
G005 Rye, Perennial: <0.1 kU/L
Goldenrod: <0.1 kU/L
HOUSE DUST HOLLISTER: 0.1 kU/L — AB
Helminthosporium halodes: <0.1 kU/L
IGE (IMMUNOGLOBULIN E), SERUM: 863.9 [IU]/mL — AB (ref 0.0–180.0)
Lamb's Quarters: <0.1 kU/L
Stemphylium Botryosum: <0.1 kU/L
Sycamore Tree: <0.1 kU/L
Timothy Grass: <0.1 kU/L

## 2014-03-12 ENCOUNTER — Ambulatory Visit (INDEPENDENT_AMBULATORY_CARE_PROVIDER_SITE_OTHER)
Admission: RE | Admit: 2014-03-12 | Discharge: 2014-03-12 | Disposition: A | Discharge status: Home / Self Care | Payer: Medicare Other | Source: Ambulatory Visit | Attending: Internal Medicine | Admitting: Internal Medicine

## 2014-03-12 ENCOUNTER — Telehealth: Payer: Self-pay | Admitting: Internal Medicine

## 2014-03-12 DIAGNOSIS — R600 Localized edema: Secondary | ICD-10-CM

## 2014-03-12 DIAGNOSIS — R0902 Hypoxemia: Secondary | ICD-10-CM

## 2014-03-12 DIAGNOSIS — R609 Edema, unspecified: Secondary | ICD-10-CM

## 2014-03-12 MED ORDER — IOHEXOL 300 MG/ML  SOLN
76.0000 mL | Freq: Once | INTRAMUSCULAR | Status: AC | PRN
  Administered 2014-03-12: 76 mL via INTRAVENOUS

## 2014-03-12 NOTE — Telephone Encounter (Signed)
mandy called back and stated that these results were faxed to the fax machine up front.  Katie have you received these yet?  thanks

## 2014-03-12 NOTE — Telephone Encounter (Signed)
LMOMTCB x1 for mandy Was results faxed over?

## 2014-03-12 NOTE — Telephone Encounter (Signed)
Order has been placed Per [facility] use hypoxia as DX.  Called pt to make aware of results. LMCTCB x1

## 2014-03-12 NOTE — Progress Notes (Signed)
Quick Note:  lmtcb ______ 

## 2014-03-12 NOTE — Telephone Encounter (Signed)
Per CY-order 2l/m QHS oxygen based on ONO 03-11-14.

## 2014-03-15 NOTE — Telephone Encounter (Signed)
Pt aware. Cherril Hett, CMA  

## 2014-04-08 DIAGNOSIS — G4733 Obstructive sleep apnea (adult) (pediatric): Secondary | ICD-10-CM | POA: Insufficient documentation

## 2014-04-08 DIAGNOSIS — J310 Chronic rhinitis: Secondary | ICD-10-CM | POA: Insufficient documentation

## 2014-04-08 NOTE — Assessment & Plan Note (Addendum)
NPSG 05/11/12-[facility]- Mild obstructive sleep apnea, AHI 14.9 per hour with body weight 288 pounds.PLMI 5.3/ hr. CPAP titration 06/03/12 to 11 Poor sleep habits and tendency towards insomnia Plan-educated on sleep hygiene. Results of sleep study were pending but she left and will be reviewed at next office. Anticipate recommending CPAP trial. Overnight oximetry

## 2014-04-13 ENCOUNTER — Ambulatory Visit: Payer: Medicare Other | Admitting: Internal Medicine

## 2014-05-18 ENCOUNTER — Ambulatory Visit (INDEPENDENT_AMBULATORY_CARE_PROVIDER_SITE_OTHER)
Admission: RE | Admit: 2014-05-18 | Discharge: 2014-05-18 | Disposition: A | Discharge status: Home / Self Care | Payer: Medicare Other | Source: Ambulatory Visit | Attending: Internal Medicine | Admitting: Internal Medicine

## 2014-05-18 ENCOUNTER — Encounter: Payer: Self-pay | Admitting: Internal Medicine

## 2014-05-18 ENCOUNTER — Ambulatory Visit (INDEPENDENT_AMBULATORY_CARE_PROVIDER_SITE_OTHER): Payer: Medicare Other | Admitting: Family Medicine

## 2014-05-18 ENCOUNTER — Encounter: Payer: Self-pay | Admitting: Family Medicine

## 2014-05-18 ENCOUNTER — Ambulatory Visit (INDEPENDENT_AMBULATORY_CARE_PROVIDER_SITE_OTHER): Payer: Medicare Other | Admitting: Internal Medicine

## 2014-05-18 VITALS — BP 116/86 | HR 69 | Ht 62.0 in | Wt 284.6 lb

## 2014-05-18 VITALS — BP 134/82 | HR 66 | Temp 98.3°F | Wt 283.0 lb

## 2014-05-18 DIAGNOSIS — R0600 Dyspnea, unspecified: Secondary | ICD-10-CM

## 2014-05-18 DIAGNOSIS — R0609 Other forms of dyspnea: Secondary | ICD-10-CM

## 2014-05-18 DIAGNOSIS — E785 Hyperlipidemia, unspecified: Secondary | ICD-10-CM

## 2014-05-18 DIAGNOSIS — R609 Edema, unspecified: Secondary | ICD-10-CM

## 2014-05-18 DIAGNOSIS — I10 Essential (primary) hypertension: Secondary | ICD-10-CM

## 2014-05-18 DIAGNOSIS — R0989 Other specified symptoms and signs involving the circulatory and respiratory systems: Secondary | ICD-10-CM

## 2014-05-18 DIAGNOSIS — IMO0002 Reserved for concepts with insufficient information to code with codable children: Secondary | ICD-10-CM

## 2014-05-18 DIAGNOSIS — R82998 Other abnormal findings in urine: Secondary | ICD-10-CM

## 2014-05-18 DIAGNOSIS — F515 Nightmare disorder: Secondary | ICD-10-CM

## 2014-05-18 DIAGNOSIS — E1159 Type 2 diabetes mellitus with other circulatory complications: Secondary | ICD-10-CM

## 2014-05-18 LAB — HEMOGLOBIN A1C: Hgb A1c MFr Bld: 6.6 % — ABNORMAL HIGH (ref 4.6–6.5)

## 2014-05-18 LAB — POCT URINALYSIS DIPSTICK
Blood, UA: NEGATIVE
GLUCOSE UA: NEGATIVE
KETONES UA: NEGATIVE
Nitrite, UA: NEGATIVE
Protein, UA: NEGATIVE
Urobilinogen, UA: 0.2
pH, UA: 5

## 2014-05-18 LAB — BASIC METABOLIC PANEL
BUN: 12 mg/dL (ref 6–23)
CHLORIDE: 100 meq/L (ref 96–112)
CO2: 30 meq/L (ref 19–32)
CREATININE: 1 mg/dL (ref 0.4–1.2)
Calcium: 9.6 mg/dL (ref 8.4–10.5)
GFR: 75.38 mL/min (ref >60.00)
Glucose, Bld: 97 mg/dL (ref 70–99)
Potassium: 4.2 mEq/L (ref 3.5–5.1)
SODIUM: 138 meq/L (ref 135–145)

## 2014-05-18 LAB — HEPATIC FUNCTION PANEL
ALBUMIN: 4 g/dL (ref 3.5–5.2)
ALT: 16 U/L (ref 0–35)
AST: 24 U/L (ref 0–37)
Alkaline Phosphatase: 87 U/L (ref 39–117)
Bilirubin, Direct: 0.2 mg/dL (ref 0.0–0.3)
TOTAL PROTEIN: 7.3 g/dL (ref 6.0–8.3)
Total Bilirubin: 1 mg/dL (ref 0.2–1.2)

## 2014-05-18 LAB — LIPID PANEL
Cholesterol: 194 mg/dL (ref 0–200)
HDL: 59.8 mg/dL (ref >39.00)
LDL Cholesterol: 116 mg/dL — ABNORMAL HIGH (ref 0–99)
NonHDL: 134.2
TRIGLYCERIDES: 92 mg/dL (ref 0.0–149.0)
Total CHOL/HDL Ratio: 3
VLDL: 18.4 mg/dL (ref 0.0–40.0)

## 2014-05-18 LAB — CBC WITH DIFFERENTIAL/PLATELET
BASOS PCT: 0.5 % (ref 0.0–3.0)
Basophils Absolute: 0 10*3/uL (ref 0.0–0.1)
EOS ABS: 0.2 10*3/uL (ref 0.0–0.7)
Eosinophils Relative: 3.8 % (ref 0.0–5.0)
HCT: 41.4 % (ref 36.0–46.0)
HEMOGLOBIN: 13.4 g/dL (ref 12.0–15.0)
Lymphocytes Relative: 36.8 % (ref 12.0–46.0)
Lymphs Abs: 1.9 10*3/uL (ref 0.7–4.0)
MCHC: 32.4 g/dL (ref 30.0–36.0)
MCV: 86.2 fl (ref 78.0–100.0)
MONO ABS: 0.4 10*3/uL (ref 0.1–1.0)
Monocytes Relative: 6.7 % (ref 3.0–12.0)
NEUTROS ABS: 2.8 10*3/uL (ref 1.4–7.7)
Neutrophils Relative %: 52.2 % (ref 43.0–77.0)
Platelets: 251 10*3/uL (ref 150.0–400.0)
RBC: 4.81 Mil/uL (ref 3.87–5.11)
RDW: 14.1 % (ref 11.5–15.5)
WBC: 5.3 10*3/uL (ref 4.0–10.5)

## 2014-05-18 LAB — MICROALBUMIN / CREATININE URINE RATIO
MICROALB UR: 1 mg/dL (ref 0.0–1.9)
Microalb Creat Ratio: 0.2 mg/g (ref 0.0–30.0)

## 2014-05-18 MED ORDER — AMITRIPTYLINE HCL 10 MG PO TABS: 10.0000 mg | ORAL_TABLET | Freq: Every day | ORAL | Status: DC

## 2014-05-18 NOTE — Patient Instructions (Addendum)
Take otc potassium every other day, when you take Lasix/furosemide      One brand is Slow-Mag  Take Lasix/furosemide every other day, at least for a week. See what it does for your weight and swelling  Continue daily spironolactone  Script for amitriptylline to take at bedtime. See if it reduces nightmares  Take antihistamine like zyrtec or allegra when needed for allergy  Order CXR- dx dyspnea

## 2014-05-18 NOTE — Addendum Note (Signed)
Addended by: Verdie ShireBAYNES, ANGELA M on: 05/18/2014 04:22 PM   Modules accepted: Orders

## 2014-05-18 NOTE — Patient Instructions (Signed)
Diabetes and Standards of Medical Care  Diabetes is complicated. You may find that your diabetes team includes a dietitian, nurse, diabetes educator, eye doctor, and more. To help everyone know what is going on and to help you get the care you deserve, the following schedule of care was developed to help keep you on track. Below are the tests, exams, vaccines, medicines, education, and plans you will need. HbA1c test This test shows how well you have controlled your glucose over the past 2-3 months. It is used to see if your diabetes management plan needs to be adjusted.   It is performed at least 2 times a year if you are meeting treatment goals.  It is performed 4 times a year if therapy has changed or if you are not meeting treatment goals. Blood pressure test  This test is performed at every routine medical visit. The goal is less than 140/90 mmHg for most people, but 130/80 mmHg in some cases. Ask your health care Macarthur Lorusso about your goal. Dental exam  Follow up with the dentist regularly. Eye exam  If you are diagnosed with type 1 diabetes as a child, get an exam upon reaching the age of 10 years or older and have had diabetes for 3-5 years. Yearly eye exams are recommended after that initial eye exam.  If you are diagnosed with type 1 diabetes as an adult, get an exam within 5 years of diagnosis and then yearly.  If you are diagnosed with type 2 diabetes, get an exam as soon as possible after the diagnosis and then yearly. Foot care exam  Visual foot exams are performed at every routine medical visit. The exams check for cuts, injuries, or other problems with the feet.  A comprehensive foot exam should be done yearly. This includes visual inspection as well as assessing foot pulses and testing for loss of sensation.  Check your feet nightly for cuts, injuries, or other problems with your feet. Tell your health care Kathreen Dileo if anything is not healing. Kidney function test (urine  microalbumin)  This test is performed once a year.  Type 1 diabetes: The first test is performed 5 years after diagnosis.  Type 2 diabetes: The first test is performed at the time of diagnosis.  A serum creatinine and estimated glomerular filtration rate (eGFR) test is done once a year to assess the level of chronic kidney disease (CKD), if present. Lipid profile (cholesterol, HDL, LDL, triglycerides)  Performed every 5 years for most people.  The goal for LDL is less than 100 mg/dL. If you are at high risk, the goal is less than 70 mg/dL.  The goal for HDL is 40 mg/dL-50 mg/dL for men and 50 mg/dL-60 mg/dL for women. An HDL cholesterol of 60 mg/dL or higher gives some protection against heart disease.  The goal for triglycerides is less than 150 mg/dL. Influenza vaccine, pneumococcal vaccine, and hepatitis B vaccine  The influenza vaccine is recommended yearly.  The pneumococcal vaccine is generally given once in a lifetime. However, there are some instances when another vaccination is recommended. Check with your health care Dawana Asper.  The hepatitis B vaccine is also recommended for adults with diabetes. Diabetes self-management education  Education is recommended at diagnosis and ongoing as needed. Treatment plan  Your treatment plan is reviewed at every medical visit. Document Released: 09/09/2009 Document Revised: 07/15/2013 Document Reviewed: 04/14/2013 Exit[employer] Patient Information 2015 [employer]. This information is not intended to replace advice given to you by your   health care Krishana Lutze. Make sure you discuss any questions you have with your health care Terron Merfeld.

## 2014-05-18 NOTE — Progress Notes (Signed)
   Subjective:    Patient ID: Amy Mcgee, female    DOB: October 23, 1951, 63 y.o.   MRN: [id number]  HPI  HPI HYPERTENSION  Blood pressure range-not checking  Chest pain- no      Dyspnea- no Lightheadedness- no   Edema- yes-- face Other side effects - no   Medication compliance: good Low salt diet- yes   DIABETES  Blood Sugar ranges-98-154  Polyuria- no New Visual problems- no Hypoglycemic symptoms- no Other side effects-no Medication compliance - good Last eye exam- 2014 Foot exam- today  HYPERLIPIDEMIA  Medication compliance- good RUQ pain- no  Muscle aches- no Other side effects-no    Review of Systems As above Objective:   Physical Exam  BP 134/82  Pulse 66  Temp(Src) 98.3 F (36.8 C) (Oral)  Wt 283 lb (128.368 kg)  SpO2 99% General appearance: alert, cooperative, appears stated age and no distress Neck: no adenopathy, no carotid bruit and thyroid not enlarged, symmetric, no tenderness/mass/nodules Lungs: clear to auscultation bilaterally Heart: S1, S2 normal Extremities: extremities normal, atraumatic, no cyanosis or edema Neurologic: Alert and oriented X 3, normal strength and tone. Normal symmetric reflexes. Normal coordination and gait  Sensory exam of the foot is normal, tested with the monofilament. Good pulses, no lesions or ulcers, good peripheral pulses.      Assessment & Plan:  1. Type II or unspecified type diabetes mellitus with peripheral circulatory disorders, uncontrolled(250.72) Check labs - Hemoglobin A1c - Microalbumin / creatinine urine ratio - POCT urinalysis dipstick  2. Essential hypertension Stable, chek labs - Basic metabolic panel - CBC with Differential  3. Other and unspecified hyperlipidemia Check labs - Hepatic function panel - Lipid panel

## 2014-05-18 NOTE — Progress Notes (Signed)
Pre visit review using our clinic review tool, if applicable. No additional management support is needed unless otherwise documented below in the visit note. 

## 2014-05-18 NOTE — Progress Notes (Signed)
03/09/14- 89 yoF never smoker Dr Laury AxonLowne- concerned about sleep apnea and facial swelling. NPSG 05/11/12-[facility]-  Mild obstructive sleep apnea, AHI 14.9 per hour with body weight 288 pounds.PLMI 5.3/ hr. CPAP titration 06/03/12 to 11. She reports snoring for years. Sleeps in a recliner. Bedtime between 1 AM and 3 AM "could stay up all night", getting up 4 times before up in the morning between 10 AM and 11 AM. Says she has had 3 sleep studies but "never needed CPAP".  Lying down, feels stuffy in the nose. For 7 years has been experiencing periorbital edema. Had facial rash with negative workup for lupus. Gets sinus congestion treated with nose sprays, daily frontal headaches, joint pains and muscle spasms. Skin testing a year ago elsewhere reported negative. No ENT surgery. Facial swelling but better on a prednisone taper. Denies tongue swelling, hives or asthma. Labs 02/04/2014-sedimentation rate 31, ANA negative, rheumatoid factor normal, C4 complement and C1esterace negative. Divorced, living alone.  05/18/14- [age] yoF never smoker Dr Laury AxonLowne- concerned about sleep apnea and facial swelling FOLLOWS FOR: Pt reports good tolerance of O2 2L/  at night with sleep. Uses also during naps. Pt states that she still has difficulty sleeping. Pt c/o nightmares--pt explains this as recurrent nightmares anytime she sleeps.  Daughter here With oxygen during sleep she no longer wakes with headaches but she still has frequent nightmares attributed in color screening. She is concerned about very limited exercise tolerance-exhausted by using bathroom and says she has to go lie down. She again complains of facial soft tissue swelling. CT of the face showed prominent soft tissue swelling without sinus disease. She has been taking spironolactone, has Lasix but has not been taking it regularly. Labs reviewed. Allergy profile- 03/11/14- Total IgE 863.9, + dust, box elder ONOX 03/11/14 qualified for sleep O2 2L CT max fac  03/12/14 FINDINGS:  There is no significant maxillary, ethmoid, frontal, or sphenoid  sinus fluid. Bilateral preseptal periorbital soft tissue swelling is  present. Bilateral malar soft tissue swelling is present. There is  no focal abscess. No mandibular or maxillary periapical abscess is  seen. No nasal cavity masses. No retropharyngeal soft tissue  swelling. Prior cervical fusion. Visualized intracranial compartment  unremarkable.  IMPRESSION:  Facial swelling without obvious cause.  Electronically Signed  By: Davonna BellingJohn Curnes M.D.  On: 03/12/2014 16:30  ROS-see HPI Constitutional:   No-   weight loss, night sweats, fevers, chills, fatigue, lassitude. HEENT:   + headaches, no-difficulty swallowing, tooth/dental problems, sore throat,       No-  sneezing, itching, ear ache, +nasal congestion, post nasal drip,  CV:  No-   chest pain, orthopnea, PND, swelling in lower extremities, anasarca,                                  dizziness, palpitations Resp: No-   shortness of breath with exertion or at rest.              No-   productive cough,  No non-productive cough,  No- coughing up of blood.              No-   change in color of mucus.  No- wheezing.   Skin: No-   rash or lesions. GI:  No-   heartburn, indigestion, abdominal pain, nausea, vomiting,  GU:  MS:  No-   joint pain or swelling.   Neuro-     nothing  unusual Psych:  No- change in mood or affect. No depression or anxiety.  No memory loss.  OBJ- Physical Exam General- Alert, Oriented, Affect-appropriate, Distress- none acute,+very overweight Skin- rash-none, lesions- none, excoriation- none Lymphadenopathy- none Head- atraumatic            Eyes- Gross vision intact, PERRLA, conjunctivae and secretions clear, + periorbital                                edema            Ears- Hearing, canals-normal            Nose- Clear, no-Septal dev, mucus, polyps, erosion, perforation             Throat- Mallampati II , mucosa clear ,  drainage- none, tonsils+ present Neck- flexible , trachea midline, no stridor , thyroid nl, carotid no bruit Chest - symmetrical excursion , unlabored           Heart/CV- RRR , no murmur , no gallop  , no rub, nl s1 s2                           - JVD- none , edema- none, stasis changes- none, varices- none           Lung- clear to P&A, wheeze- none, cough- none , dullness-none, rub- none           Chest wall-  Abd- tender-no, distended-no, bowel sounds-present, HSM- no Br/ Gen/ Rectal- Not done, not indicated Extrem- cyanosis- none, clubbing, none, atrophy- none, strength- nl.+fat ankles with generalized nonspecific mild tenderness Neuro- grossly intact to observation

## 2014-05-18 NOTE — Addendum Note (Signed)
Addended by: BAYNES, ANGELA M on: 05/18/2014 04:22 PM   Modules accepted: Orders  

## 2014-05-19 ENCOUNTER — Telehealth: Payer: Self-pay | Admitting: Family Medicine

## 2014-05-19 DIAGNOSIS — R600 Localized edema: Secondary | ICD-10-CM | POA: Insufficient documentation

## 2014-05-19 DIAGNOSIS — R0609 Other forms of dyspnea: Secondary | ICD-10-CM | POA: Insufficient documentation

## 2014-05-19 DIAGNOSIS — R609 Edema, unspecified: Secondary | ICD-10-CM | POA: Insufficient documentation

## 2014-05-19 DIAGNOSIS — F515 Nightmare disorder: Secondary | ICD-10-CM | POA: Insufficient documentation

## 2014-05-19 LAB — URINE CULTURE
Colony Count: NO GROWTH
Organism ID, Bacteria: NO GROWTH

## 2014-05-19 NOTE — Telephone Encounter (Signed)
Relevant patient education mailed to patient.  

## 2014-05-19 NOTE — Assessment & Plan Note (Signed)
Plan-try using antidepressant to suppress REM and reduce her nightmares. Medication discussed.

## 2014-05-19 NOTE — Assessment & Plan Note (Signed)
I'm not sure there's anything more going on than obesity with deconditioning Plan-chest x-ray

## 2014-05-19 NOTE — Assessment & Plan Note (Signed)
I'm not sure that she has a lot of edema. Much of what I see appears to be obesity. Plan-therapeutic trial taking Lasix 40 mg every other day with potassium every other day for one or 2 weeks, following weight. Continue spironolactone 1 daily.

## 2014-05-21 MED ORDER — ATORVASTATIN CALCIUM 20 MG PO TABS: 20.0000 mg | ORAL_TABLET | Freq: Every day | ORAL | Status: DC

## 2014-06-10 NOTE — Progress Notes (Unsigned)
This encounter was created in error - please disregard.

## 2014-08-12 ENCOUNTER — Other Ambulatory Visit: Payer: Self-pay | Admitting: Family Medicine

## 2014-08-18 ENCOUNTER — Encounter: Payer: Self-pay | Admitting: Internal Medicine

## 2014-09-17 ENCOUNTER — Ambulatory Visit: Payer: Medicare Other | Admitting: Internal Medicine

## 2014-09-18 ENCOUNTER — Other Ambulatory Visit: Payer: Self-pay | Admitting: Family Medicine

## 2014-10-25 ENCOUNTER — Other Ambulatory Visit: Payer: Self-pay | Admitting: Family Medicine

## 2014-11-15 ENCOUNTER — Encounter: Payer: Self-pay | Admitting: Family Medicine

## 2014-11-15 ENCOUNTER — Ambulatory Visit (INDEPENDENT_AMBULATORY_CARE_PROVIDER_SITE_OTHER): Payer: Medicare Other | Admitting: Family Medicine

## 2014-11-15 ENCOUNTER — Ambulatory Visit (INDEPENDENT_AMBULATORY_CARE_PROVIDER_SITE_OTHER): Payer: Medicare Other | Admitting: Internal Medicine

## 2014-11-15 ENCOUNTER — Encounter: Payer: Self-pay | Admitting: Internal Medicine

## 2014-11-15 VITALS — BP 118/74 | HR 64 | Ht 62.0 in | Wt 272.4 lb

## 2014-11-15 VITALS — BP 128/80 | HR 50 | Temp 97.9°F | Resp 16 | Wt 268.6 lb

## 2014-11-15 DIAGNOSIS — G4733 Obstructive sleep apnea (adult) (pediatric): Secondary | ICD-10-CM

## 2014-11-15 DIAGNOSIS — F411 Generalized anxiety disorder: Secondary | ICD-10-CM

## 2014-11-15 DIAGNOSIS — Z23 Encounter for immunization: Secondary | ICD-10-CM

## 2014-11-15 DIAGNOSIS — E119 Type 2 diabetes mellitus without complications: Secondary | ICD-10-CM

## 2014-11-15 DIAGNOSIS — E785 Hyperlipidemia, unspecified: Secondary | ICD-10-CM

## 2014-11-15 DIAGNOSIS — R609 Edema, unspecified: Secondary | ICD-10-CM

## 2014-11-15 DIAGNOSIS — I10 Essential (primary) hypertension: Secondary | ICD-10-CM

## 2014-11-15 LAB — LIPID PANEL
CHOLESTEROL: 155 mg/dL (ref 0–200)
HDL: 50 mg/dL (ref >39.00)
LDL Cholesterol: 92 mg/dL (ref 0–99)
NonHDL: 105
Total CHOL/HDL Ratio: 3
Triglycerides: 67 mg/dL (ref 0.0–149.0)
VLDL: 13.4 mg/dL (ref 0.0–40.0)

## 2014-11-15 LAB — CBC WITH DIFFERENTIAL/PLATELET
Basophils Absolute: 0 10*3/uL (ref 0.0–0.1)
Basophils Relative: 0.3 % (ref 0.0–3.0)
Eosinophils Absolute: 0.1 10*3/uL (ref 0.0–0.7)
Eosinophils Relative: 2.8 % (ref 0.0–5.0)
HCT: 37.8 % (ref 36.0–46.0)
Hemoglobin: 12 g/dL (ref 12.0–15.0)
Lymphocytes Relative: 30.1 % (ref 12.0–46.0)
Lymphs Abs: 1.3 10*3/uL (ref 0.7–4.0)
MCHC: 31.8 g/dL (ref 30.0–36.0)
MCV: 86.5 fl (ref 78.0–100.0)
MONO ABS: 0.3 10*3/uL (ref 0.1–1.0)
MONOS PCT: 7.5 % (ref 3.0–12.0)
NEUTROS PCT: 59.3 % (ref 43.0–77.0)
Neutro Abs: 2.7 10*3/uL (ref 1.4–7.7)
PLATELETS: 242 10*3/uL (ref 150.0–400.0)
RBC: 4.37 Mil/uL (ref 3.87–5.11)
RDW: 13.6 % (ref 11.5–15.5)
WBC: 4.5 10*3/uL (ref 4.0–10.5)

## 2014-11-15 LAB — POCT URINALYSIS DIPSTICK
Blood, UA: NEGATIVE
GLUCOSE UA: NEGATIVE
Leukocytes, UA: NEGATIVE
Nitrite, UA: NEGATIVE
PH UA: 5
Urobilinogen, UA: 2

## 2014-11-15 LAB — MICROALBUMIN / CREATININE URINE RATIO
CREATININE, U: 311.1 mg/dL
MICROALB/CREAT RATIO: 0.4 mg/g (ref 0.0–30.0)
Microalb, Ur: 1.3 mg/dL (ref 0.0–1.9)

## 2014-11-15 LAB — HEPATIC FUNCTION PANEL
ALT: 13 U/L (ref 0–35)
AST: 19 U/L (ref 0–37)
Albumin: 3.7 g/dL (ref 3.5–5.2)
Alkaline Phosphatase: 103 U/L (ref 39–117)
Bilirubin, Direct: 0.2 mg/dL (ref 0.0–0.3)
Total Bilirubin: 0.9 mg/dL (ref 0.2–1.2)
Total Protein: 7 g/dL (ref 6.0–8.3)

## 2014-11-15 LAB — BASIC METABOLIC PANEL
BUN: 15 mg/dL (ref 6–23)
CO2: 26 mEq/L (ref 19–32)
CREATININE: 0.9 mg/dL (ref 0.4–1.2)
Calcium: 8.9 mg/dL (ref 8.4–10.5)
Chloride: 101 mEq/L (ref 96–112)
GFR: 81.08 mL/min (ref >60.00)
Glucose, Bld: 90 mg/dL (ref 70–99)
Potassium: 3.8 mEq/L (ref 3.5–5.1)
Sodium: 137 mEq/L (ref 135–145)

## 2014-11-15 LAB — HEMOGLOBIN A1C: HEMOGLOBIN A1C: 6.7 % — AB (ref 4.6–6.5)

## 2014-11-15 MED ORDER — ATORVASTATIN CALCIUM 20 MG PO TABS: 20.0000 mg | ORAL_TABLET | Freq: Every day | ORAL | Status: DC

## 2014-11-15 MED ORDER — AMITRIPTYLINE HCL 10 MG PO TABS: ORAL_TABLET | ORAL | Status: DC

## 2014-11-15 MED ORDER — ALPRAZOLAM 0.25 MG PO TABS: 0.2500 mg | ORAL_TABLET | Freq: Three times a day (TID) | ORAL | Status: DC | PRN

## 2014-11-15 MED ORDER — AMLODIPINE BESYLATE 5 MG PO TABS: 5.0000 mg | ORAL_TABLET | Freq: Every day | ORAL | Status: DC

## 2014-11-15 MED ORDER — SERTRALINE HCL 50 MG PO TABS: 50.0000 mg | ORAL_TABLET | Freq: Every day | ORAL | Status: DC

## 2014-11-15 MED ORDER — CITALOPRAM HYDROBROMIDE 40 MG PO TABS: 40.0000 mg | ORAL_TABLET | Freq: Every day | ORAL | Status: DC

## 2014-11-15 MED ORDER — FUROSEMIDE 40 MG PO TABS: 40.0000 mg | ORAL_TABLET | ORAL | Status: DC | PRN

## 2014-11-15 MED ORDER — SPIRONOLACTONE 50 MG PO TABS: 50.0000 mg | ORAL_TABLET | Freq: Every day | ORAL | Status: DC

## 2014-11-15 NOTE — Patient Instructions (Signed)
Diabetes and Standards of Medical Care Diabetes is complicated. You may find that your diabetes team includes a dietitian, nurse, diabetes educator, eye doctor, and more. To help everyone know what is going on and to help you get the care you deserve, the following schedule of care was developed to help keep you on track. Below are the tests, exams, vaccines, medicines, education, and plans you will need. HbA1c test This test shows how well you have controlled your glucose over the past 2-3 months. It is used to see if your diabetes management plan needs to be adjusted.   It is performed at least 2 times a year if you are meeting treatment goals.  It is performed 4 times a year if therapy has changed or if you are not meeting treatment goals. Blood pressure test  This test is performed at every routine medical visit. The goal is less than 140/90 mm Hg for most people, but 130/80 mm Hg in some cases. Ask your health care provider about your goal. Dental exam  Follow up with the dentist regularly. Eye exam  If you are diagnosed with type 1 diabetes as a child, get an exam upon reaching the age of 37 years or older and have had diabetes for 3-5 years. Yearly eye exams are recommended after that initial eye exam.  If you are diagnosed with type 1 diabetes as an adult, get an exam within 5 years of diagnosis and then yearly.  If you are diagnosed with type 2 diabetes, get an exam as soon as possible after the diagnosis and then yearly. Foot care exam  Visual foot exams are performed at every routine medical visit. The exams check for cuts, injuries, or other problems with the feet.  A comprehensive foot exam should be done yearly. This includes visual inspection as well as assessing foot pulses and testing for loss of sensation.  Check your feet nightly for cuts, injuries, or other problems with your feet. Tell your health care provider if anything is not healing. Kidney function test (urine  microalbumin)  This test is performed once a year.  Type 1 diabetes: The first test is performed 5 years after diagnosis.  Type 2 diabetes: The first test is performed at the time of diagnosis.  A serum creatinine and estimated glomerular filtration rate (eGFR) test is done once a year to assess the level of chronic kidney disease (CKD), if present. Lipid profile (cholesterol, HDL, LDL, triglycerides)  Performed every 5 years for most people.  The goal for LDL is less than 100 mg/dL. If you are at high risk, the goal is less than 70 mg/dL.  The goal for HDL is 40 mg/dL-50 mg/dL for men and 50 mg/dL-60 mg/dL for women. An HDL cholesterol of 60 mg/dL or higher gives some protection against heart disease.  The goal for triglycerides is less than 150 mg/dL. Influenza vaccine, pneumococcal vaccine, and hepatitis B vaccine  The influenza vaccine is recommended yearly.  It is recommended that people with diabetes who are over 24 years old get the pneumonia vaccine. In some cases, two separate shots may be given. Ask your health care provider if your pneumonia vaccination is up to date.  The hepatitis B vaccine is also recommended for adults with diabetes. Diabetes self-management education  Education is recommended at diagnosis and ongoing as needed. Treatment plan  Your treatment plan is reviewed at every medical visit. Document Released: 09/09/2009 Document Revised: 03/29/2014 Document Reviewed: 04/14/2013 [facility] Patient Information 2015 [place],  [employer]. This information is not intended to replace advice given to you by your health care provider. Make sure you discuss any questions you have with your health care provider.  

## 2014-11-15 NOTE — Progress Notes (Signed)
Subjective:    Patient ID: Amy CoverSarah Bezek, female    DOB: 21-Jan-1951, 63 y.o.   MRN: [id number]  HPI Pt here f/u --she is requesting to change her anxiety meds --- she still feels stressed.   HYPERTENSION  Blood pressure range-not checking   Chest pain- no      Dyspnea- no Lightheadedness- no   Edema- no Other side effects - no   Medication compliance: good Low salt diet- yes  DIABETES  Blood Sugar ranges-98-101  Polyuria- no New Visual problems- no Hypoglycemic symptoms- no Other side effects-no Medication compliance - good Last eye exam- 09/2014 Foot exam- today  HYPERLIPIDEMIA  Medication compliance- good RUQ pain- no  Muscle aches- no Other side effects-no   Past Medical History  Diagnosis Date  . Hypercholesterolemia   . Hypertension   . Sinusitis    History  Substance Use Topics  . Smoking status: Never Smoker   . Smokeless tobacco: Not on file  . Alcohol Use: No   Current Outpatient Prescriptions  Medication Sig Dispense Refill  . ACCU-CHEK FASTCLIX LANCETS MISC Check blood sugar once a day 102 each 12  . albuterol (PROAIR HFA) 108 (90 BASE) MCG/ACT inhaler Inhale 2 puffs into the lungs every 6 (six) hours as needed for wheezing or shortness of breath. 1 Inhaler 3  . ALPRAZolam (XANAX) 0.25 MG tablet Take 1 tablet (0.25 mg total) by mouth 3 (three) times daily as needed for anxiety or sleep. 30 tablet 1  . amLODipine (NORVASC) 5 MG tablet Take 1 tablet (5 mg total) by mouth daily. 90 tablet 1  . atorvastatin (LIPITOR) 20 MG tablet Take 1 tablet (20 mg total) by mouth daily. 30 tablet 5  . Blood Glucose Monitoring Suppl (ACCU-CHEK AVIVA) device Use as instructed 1 each 0  . cetirizine (ZYRTEC) 10 MG tablet Take 10 mg by mouth daily.    . cholecalciferol (VITAMIN D) 1000 UNITS tablet Take 1,000 Units by mouth daily.    . cyclobenzaprine (FLEXERIL) 10 MG tablet Take 10 mg by mouth 3 (three) times daily as needed.    . furosemide (LASIX) 40 MG tablet Take 1  tablet (40 mg total) by mouth as needed. 30 tablet 5  . glucose blood (ACCU-CHEK AVIVA PLUS) test strip Use as instructed 100 each 12  . ipratropium-albuterol (DUONEB) 0.5-2.5 (3) MG/3ML SOLN Take 3 mLs by nebulization every 6 (six) hours as needed.    . metaxalone (SKELAXIN) 800 MG tablet   2  . metFORMIN (GLUCOPHAGE) 500 MG tablet Take 1 tablet (500 mg total) by mouth 2 (two) times daily with a meal. 180 tablet 1  . methadone (DOLOPHINE) 5 MG tablet   0  . OXYGEN-HELIUM IN Inhale 2 L into the lungs at bedtime.    . Potassium Chloride ER 20 MEQ TBCR   3  . potassium chloride SA (K-DUR,KLOR-CON) 20 MEQ tablet Take 40 mEq by mouth once.    Marland Kitchen. spironolactone (ALDACTONE) 50 MG tablet Take 1 tablet (50 mg total) by mouth daily. 90 tablet 1  . Vitamin D, Ergocalciferol, (DRISDOL) 50000 UNITS CAPS capsule Take 1 capsule (50,000 Units total) by mouth every 7 (seven) days. 4 capsule 2  . amitriptyline (ELAVIL) 10 MG tablet 1-3 at bedtime as directed for nightmares 50 tablet 3  . sertraline (ZOLOFT) 50 MG tablet Take 1 tablet (50 mg total) by mouth daily. 30 tablet 3   No current facility-administered medications for this visit.      Review of Systems  As above    Objective:   Physical Exam  BP 128/80 mmHg  Pulse 50  Temp(Src) 97.9 F (36.6 C) (Oral)  Resp 16  Wt 268 lb 9.6 oz (121.836 kg)  SpO2 98% General appearance: alert, cooperative, appears stated age and no distress Neck: no adenopathy, no carotid bruit, no JVD, supple, symmetrical, trachea midline and thyroid not enlarged, symmetric, no tenderness/mass/nodules Lungs: clear to auscultation bilaterally Heart: regular rate and rhythm, S1, S2 normal, no murmur, click, rub or gallop Extremities: extremities normal, atraumatic, no cyanosis or edema Sensory exam of the foot is normal, tested with the monofilament. Good pulses, no lesions or ulcers, good peripheral pulses.       Assessment & Plan:  1. Essential hypertension Stable,  con't meds - furosemide (LASIX) 40 MG tablet; Take 1 tablet (40 mg total) by mouth as needed.  Dispense: 30 tablet; Refill: 5 - spironolactone (ALDACTONE) 50 MG tablet; Take 1 tablet (50 mg total) by mouth daily.  Dispense: 90 tablet; Refill: 1 - amLODipine (NORVASC) 5 MG tablet; Take 1 tablet (5 mg total) by mouth daily.  Dispense: 90 tablet; Refill: 1 - Basic metabolic panel - CBC with Differential  2. Generalized anxiety disorder - ALPRAZolam (XANAX) 0.25 MG tablet; Take 1 tablet (0.25 mg total) by mouth 3 (three) times daily as needed for anxiety or sleep.  Dispense: 30 tablet; Refill: 1 - sertraline (ZOLOFT) 50 MG tablet; Take 1 tablet (50 mg total) by mouth daily.  Dispense: 30 tablet; Refill: 3  3. Diabetes mellitus type II, controlled Check labs,  con't meds - CBC with Differential - Hemoglobin A1c - Microalbumin / creatinine urine ratio - POCT urinalysis dipstick - Hemoglobin A1c  4. Hyperlipidemia Check labs, con't meds - atorvastatin (LIPITOR) 20 MG tablet; Take 1 tablet (20 mg total) by mouth daily.  Dispense: 30 tablet; Refill: 5 - Basic metabolic panel - CBC with Differential - Hepatic function panel - Lipid panel - POCT urinalysis dipstick  5. Need for pneumococcal vaccination   - Pneumococcal polysaccharide vaccine 23-valent greater than or equal to 2yo subcutaneous/IM - Pneumococcal conjugate vaccine 13-valent

## 2014-11-15 NOTE — Assessment & Plan Note (Signed)
She describes nightmares. We are trying an antidepressant-amitriptyline-to suppress REM. Not clear if these nightmares are due to sleep disturbance from her sleep apnea. Plan-try increasing amitriptyline to 30 mg at at bedtime. Continue Zoloft in the morning. She will consider repeating sleep study for a reassessment of need and treatment options.

## 2014-11-15 NOTE — Progress Notes (Signed)
03/09/14- [age] yoF never smoker Dr Laury AxonLowne- concerned about sleep apnea and facial swelling. NPSG 05/11/12-[facility]-  Mild obstructive sleep apnea, AHI 14.9 per hour with body weight 288 pounds.PLMI 5.3/ hr. CPAP titration 06/03/12 to 11. She reports snoring for years. Sleeps in a recliner. Bedtime between 1 AM and 3 AM "could stay up all night", getting up 4 times before up in the morning between 10 AM and 11 AM. Says she has had 3 sleep studies but "never needed CPAP".  Lying down, feels stuffy in the nose. For 7 years has been experiencing periorbital edema. Had facial rash with negative workup for lupus. Gets sinus congestion treated with nose sprays, daily frontal headaches, joint pains and muscle spasms. Skin testing a year ago elsewhere reported negative. No ENT surgery. Facial swelling but better on a prednisone taper. Denies tongue swelling, hives or asthma. Labs 02/04/2014-sedimentation rate 31, ANA negative, rheumatoid factor normal, C4 complement and C1esterace negative. Divorced, living alone.  05/18/14- [age] yoF never smoker Dr Laury AxonLowne- concerned about sleep apnea and facial swelling FOLLOWS FOR: Pt reports good tolerance of O2 2L/  at night with sleep. Uses also during naps. Pt states that she still has difficulty sleeping. Pt c/o nightmares--pt explains this as recurrent nightmares anytime she sleeps.  Daughter here With oxygen during sleep she no longer wakes with headaches but she still has frequent nightmares attributed in color screening. She is concerned about very limited exercise tolerance-exhausted by using bathroom and says she has to go lie down. She again complains of facial soft tissue swelling. CT of the face showed prominent soft tissue swelling without sinus disease. She has been taking spironolactone, has Lasix but has not been taking it regularly. Labs reviewed. Allergy profile- 03/11/14- Total IgE 863.9, + dust, box elder ONOX 03/11/14 qualified for sleep O2 2L CT max fac  03/12/14 FINDINGS:  There is no significant maxillary, ethmoid, frontal, or sphenoid  sinus fluid. Bilateral preseptal periorbital soft tissue swelling is  present. Bilateral malar soft tissue swelling is present. There is  no focal abscess. No mandibular or maxillary periapical abscess is  seen. No nasal cavity masses. No retropharyngeal soft tissue  swelling. Prior cervical fusion. Visualized intracranial compartment  unremarkable.  IMPRESSION:  Facial swelling without obvious cause.  Electronically Signed  By: Davonna BellingJohn Curnes M.D.  On: 03/12/2014 16:30  11/14/14-  [age] yoF never smoker Dr Laury AxonLowne- concerned about sleep apnea and facial swelling O2 2L sleep. Daughter here She has lost some weight taking Lasix every other day and continue spironolactone. She remains very heavy. Amitriptyline 10 mg did not change her nightmares. She is also taking Zoloft 50 mg now in the morning, instead of Celexa. CXR 05/18/2014-normal heart size, clear lungs. She continues oxygen 2 L, saying that she did not tolerate CPAP. We talked about considering another trial.  ROS-see HPI Constitutional:   No-   weight loss, night sweats, fevers, chills, fatigue, lassitude. HEENT:   + headaches, no-difficulty swallowing, tooth/dental problems, sore throat,       No-  sneezing, itching, ear ache, +nasal congestion, post nasal drip,  CV:  No-   chest pain, orthopnea, PND, swelling in lower extremities, anasarca,                                  dizziness, palpitations Resp: +shortness of breath with exertion or at rest.  No-   productive cough,  No non-productive cough,  No- coughing up of blood.              No-   change in color of mucus.  No- wheezing.   Skin: No-   rash or lesions. GI:  No-   heartburn, indigestion, abdominal pain, nausea, vomiting,  GU:  MS:  No-   joint pain or swelling.   Neuro-     nothing unusual Psych:  No- change in mood or affect. No depression or anxiety.  No memory  loss.  OBJ- Physical Exam General- Alert, Oriented, Affect-appropriate, Distress- none acute,+very overweight Skin- rash-none, lesions- none, excoriation- none Lymphadenopathy- none Head- atraumatic            Eyes- Gross vision intact, PERRLA, conjunctivae and secretions clear, + periorbital                                edema            Ears- Hearing, canals-normal            Nose- Clear, no-Septal dev, mucus, polyps, erosion, perforation             Throat- Mallampati II , mucosa clear , drainage- none, tonsils+ present Neck- flexible , trachea midline, no stridor , thyroid nl, carotid no bruit Chest - symmetrical excursion , unlabored           Heart/CV- RRR , no murmur , no gallop  , no rub, nl s1 s2                           - JVD- none , edema- none, stasis changes- none, varices- none           Lung- clear to P&A, wheeze- none, cough- none , dullness-none, rub- none           Chest wall-  Abd-  Br/ Gen/ Rectal- Not done, not indicated Extrem- cyanosis- none, clubbing, none, atrophy- none, strength- nl.+fat ankles with generalized nonspecific mild tenderness Neuro- grossly intact to observation

## 2014-11-15 NOTE — Patient Instructions (Addendum)
Script sent to [facility] to increase amitriptylline to 1-3 as needed at bedtime to see if we can reduce the nightmares.  You may need to see if treatment for sleep apnea would stop nightmares. This may be best managed by a sleep medicine doctor in your home town.  Ok to continue O2 2L/  [facility] for sleep

## 2014-11-15 NOTE — Assessment & Plan Note (Signed)
Edema and fluid retention or better controlled with Lasix every other day. She understands this needs to be considered and managed by her primary physician.

## 2015-01-17 ENCOUNTER — Encounter: Payer: Self-pay | Admitting: Internal Medicine

## 2015-01-17 ENCOUNTER — Ambulatory Visit (INDEPENDENT_AMBULATORY_CARE_PROVIDER_SITE_OTHER): Payer: Medicare Other | Admitting: Internal Medicine

## 2015-01-17 VITALS — BP 120/70 | HR 67 | Ht 62.0 in | Wt 257.4 lb

## 2015-01-17 DIAGNOSIS — G4733 Obstructive sleep apnea (adult) (pediatric): Secondary | ICD-10-CM

## 2015-01-17 DIAGNOSIS — F515 Nightmare disorder: Secondary | ICD-10-CM

## 2015-01-17 MED ORDER — AMITRIPTYLINE HCL 10 MG PO TABS: ORAL_TABLET | ORAL | Status: DC

## 2015-01-17 NOTE — Assessment & Plan Note (Signed)
Failed CPAP. Supplemental oxygen as help sleep quality. Amitriptyline as help sleep quality. She is making a real effort to lose weight.

## 2015-01-17 NOTE — Assessment & Plan Note (Signed)
Oxygen and amitriptyline have helped reduce nightmares.

## 2015-01-17 NOTE — Patient Instructions (Addendum)
Ok to continue O2 2L for sleep  Refill script printed for amitriptyline to help sleep

## 2015-01-17 NOTE — Progress Notes (Signed)
03/09/14- 48 yoF never smoker Dr Etter Sjogren- concerned about sleep apnea and facial swelling. NPSG 05/11/12-[facility]-  Mild obstructive sleep apnea, AHI 14.9 per hour with body weight 288 pounds.PLMI 5.3/ hr. CPAP titration 06/03/12 to 11. She reports snoring for years. Sleeps in a recliner. Bedtime between 1 AM and 3 AM "could stay up all night", getting up 4 times before up in the morning between 10 AM and 11 AM. Says she has had 3 sleep studies but "never needed CPAP".  Lying down, feels stuffy in the nose. For 7 years has been experiencing periorbital edema. Had facial rash with negative workup for lupus. Gets sinus congestion treated with nose sprays, daily frontal headaches, joint pains and muscle spasms. Skin testing a year ago elsewhere reported negative. No ENT surgery. Facial swelling but better on a prednisone taper. Denies tongue swelling, hives or asthma. Labs 02/04/2014-sedimentation rate 31, ANA negative, rheumatoid factor normal, C4 complement and C1esterace negative. Divorced, living alone.  05/18/14- 27 yoF never smoker Dr Etter Sjogren- concerned about sleep apnea and facial swelling FOLLOWS FOR: Pt reports good tolerance of O2 2L/  at night with sleep. Uses also during naps. Pt states that she still has difficulty sleeping. Pt c/o nightmares--pt explains this as recurrent nightmares anytime she sleeps.  Daughter here With oxygen during sleep she no longer wakes with headaches but she still has frequent nightmares attributed in color screening. She is concerned about very limited exercise tolerance-exhausted by using bathroom and says she has to go lie down. She again complains of facial soft tissue swelling. CT of the face showed prominent soft tissue swelling without sinus disease. She has been taking spironolactone, has Lasix but has not been taking it regularly. Labs reviewed. Allergy profile- 03/11/14- Total IgE 863.9, + dust, box elder ONOX 03/11/14 qualified for sleep O2 2L CT max fac  03/12/14 FINDINGS:  There is no significant maxillary, ethmoid, frontal, or sphenoid  sinus fluid. Bilateral preseptal periorbital soft tissue swelling is  present. Bilateral malar soft tissue swelling is present. There is  no focal abscess. No mandibular or maxillary periapical abscess is  seen. No nasal cavity masses. No retropharyngeal soft tissue  2/swelling. Prior cervical fusion. Visualized intracranial compartment  unremarkable.  IMPRESSION:  Facial swelling without obvious cause.  Electronically Signed  By: Rolla Flatten M.D.  On: 03/12/2014 16:30  11/14/14-  37 yoF never smoker Dr Etter Sjogren- concerned about sleep apnea and facial swelling O2 2L sleep. Daughter here She has lost some weight taking Lasix every other day and continue spironolactone. She remains very heavy. Amitriptyline 10 mg did not change her nightmares. She is also taking Zoloft 50 mg now in the morning, instead of Celexa. CXR 05/18/2014-normal heart size, clear lungs. She continues oxygen 2 L, saying that she did not tolerate CPAP. We talked about considering another trial.  01/17/15- 63 yoF never smoker Dr Etter Sjogren- concerned about sleep apnea and facial swelling O2 2L sleep. Daughter here Patient says oxygen for sleep "really helps" but she could never make CPAP work for her. Always a slow starter in the mornings but good later on. Still occasional nightmares but not as much. Oxygen seems to reduce that. Amitriptyline at bedtime has also helped. She is gradually dieting her weight down, so far 42 pounds down from 299 pounds we first met. She is congratulated on this effort.  ROS-see HPI Constitutional:   +  weight loss, night sweats, fevers, chills, fatigue, lassitude. HEENT:   + headaches, no-difficulty swallowing, tooth/dental problems, sore  throat,       No-  sneezing, itching, ear ache, +nasal congestion, post nasal drip,  CV:  No-   chest pain, orthopnea, PND, swelling in lower extremities, anasarca,                                   dizziness, palpitations Resp: +shortness of breath with exertion or at rest.              No-   productive cough,  No non-productive cough,  No- coughing up of blood.              No-   change in color of mucus.  No- wheezing.   Skin: No-   rash or lesions. GI:  No-   heartburn, indigestion, abdominal pain, nausea, vomiting,  GU:  MS:  No-   joint pain or swelling.   Neuro-     nothing unusual Psych:  No- change in mood or affect. No depression or anxiety.  No memory loss.  OBJ- Physical Exam General- Alert, Oriented, Affect-appropriate, Distress- none acute,+very overweight Skin- rash-none, lesions- none, excoriation- none Lymphadenopathy- none Head- atraumatic            Eyes- Gross vision intact, PERRLA, conjunctivae and secretions clear,                    + periorbital  edema            Ears- Hearing, canals-normal            Nose- Clear, no-Septal dev, mucus, polyps, erosion, perforation             Throat- Mallampati II , mucosa clear , drainage- none, tonsils+ present Neck- flexible , trachea midline, no stridor , thyroid nl, carotid no bruit Chest - symmetrical excursion , unlabored           Heart/CV- RRR , no murmur , no gallop  , no rub, nl s1 s2                           - JVD- none , edema- none, stasis changes- none, varices- none           Lung- clear to P&A, wheeze- none, cough- none , dullness-none, rub- none           Chest wall-  Abd-  Br/ Gen/ Rectal- Not done, not indicated Extrem- cyanosis- none, clubbing, none, atrophy- none, strength- nl.+fat ankles with generalized nonspecific mild tenderness Neuro- grossly intact to observation

## 2015-02-15 ENCOUNTER — Other Ambulatory Visit: Payer: Self-pay | Admitting: Family Medicine

## 2015-02-28 ENCOUNTER — Telehealth: Payer: Self-pay | Admitting: Family Medicine

## 2015-02-28 NOTE — Telephone Encounter (Signed)
Caller name: [facility]  Call back number: [phone]    Reason for call:  As per insurance requesting 90 day supply metFORMIN (GLUCOPHAGE) 500 MG tablet [facility]  [id number] [address] [place]- [place], [place] - [address] AT [facility] [address] [phone] (Phone) [phone] (Fax)

## 2015-05-17 ENCOUNTER — Ambulatory Visit (INDEPENDENT_AMBULATORY_CARE_PROVIDER_SITE_OTHER): Payer: Medicare Other | Admitting: Family Medicine

## 2015-05-17 ENCOUNTER — Encounter: Payer: Self-pay | Admitting: Family Medicine

## 2015-05-17 VITALS — BP 118/70 | HR 52 | Temp 98.2°F | Wt 261.2 lb

## 2015-05-17 DIAGNOSIS — E785 Hyperlipidemia, unspecified: Secondary | ICD-10-CM | POA: Diagnosis not present

## 2015-05-17 DIAGNOSIS — E119 Type 2 diabetes mellitus without complications: Secondary | ICD-10-CM

## 2015-05-17 DIAGNOSIS — I10 Essential (primary) hypertension: Secondary | ICD-10-CM | POA: Diagnosis not present

## 2015-05-17 DIAGNOSIS — F411 Generalized anxiety disorder: Secondary | ICD-10-CM

## 2015-05-17 DIAGNOSIS — Z23 Encounter for immunization: Secondary | ICD-10-CM

## 2015-05-17 LAB — LIPID PANEL
CHOL/HDL RATIO: 3
Cholesterol: 155 mg/dL (ref 0–200)
HDL: 55.6 mg/dL (ref >39.00)
LDL CALC: 87 mg/dL (ref 0–99)
NonHDL: 99.4
Triglycerides: 63 mg/dL (ref 0.0–149.0)
VLDL: 12.6 mg/dL (ref 0.0–40.0)

## 2015-05-17 LAB — POCT URINALYSIS DIPSTICK
Blood, UA: NEGATIVE
Glucose, UA: NEGATIVE
KETONES UA: NEGATIVE
Leukocytes, UA: NEGATIVE
Nitrite, UA: NEGATIVE
PH UA: 6
Protein, UA: NEGATIVE
Spec Grav, UA: >=1.03
UROBILINOGEN UA: 0.2

## 2015-05-17 LAB — BASIC METABOLIC PANEL
BUN: 13 mg/dL (ref 6–23)
CO2: 29 meq/L (ref 19–32)
CREATININE: 0.88 mg/dL (ref 0.40–1.20)
Calcium: 8.9 mg/dL (ref 8.4–10.5)
Chloride: 103 mEq/L (ref 96–112)
GFR: 83.08 mL/min (ref >60.00)
GLUCOSE: 79 mg/dL (ref 70–99)
Potassium: 3.6 mEq/L (ref 3.5–5.1)
Sodium: 138 mEq/L (ref 135–145)

## 2015-05-17 LAB — HEPATIC FUNCTION PANEL
ALK PHOS: 100 U/L (ref 39–117)
ALT: 11 U/L (ref 0–35)
AST: 17 U/L (ref 0–37)
Albumin: 3.5 g/dL (ref 3.5–5.2)
Bilirubin, Direct: 0.1 mg/dL (ref 0.0–0.3)
TOTAL PROTEIN: 6.8 g/dL (ref 6.0–8.3)
Total Bilirubin: 0.5 mg/dL (ref 0.2–1.2)

## 2015-05-17 LAB — MICROALBUMIN / CREATININE URINE RATIO
CREATININE, U: 286.4 mg/dL
MICROALB UR: 0.7 mg/dL (ref 0.0–1.9)
Microalb Creat Ratio: 0.2 mg/g (ref 0.0–30.0)

## 2015-05-17 LAB — HEMOGLOBIN A1C: HEMOGLOBIN A1C: 6.3 % (ref 4.6–6.5)

## 2015-05-17 MED ORDER — ALPRAZOLAM 0.25 MG PO TABS: 0.2500 mg | ORAL_TABLET | Freq: Three times a day (TID) | ORAL | Status: DC | PRN

## 2015-05-17 MED ORDER — SERTRALINE HCL 50 MG PO TABS: 50.0000 mg | ORAL_TABLET | Freq: Every day | ORAL | Status: DC

## 2015-05-17 MED ORDER — TETANUS-DIPHTH-ACELL PERTUSSIS 5-2.5-18.5 LF-MCG/0.5 IM SUSP: 0.5000 mL | Freq: Once | INTRAMUSCULAR | Status: DC

## 2015-05-17 NOTE — Progress Notes (Signed)
Patient ID: Amy Mcgee, female    DOB: [birth year]  Age: 64 y.o. MRN: [id number]    Subjective:  Subjective HPI Haylynn Pha presents for f/u dm, cholesterol and htn   HPI HYPERTENSION  Blood pressure range-not checking  Chest pain- no      Dyspnea- no Lightheadedness- no   Edema- no Other side effects - no   Medication compliance: good Low salt diet- yes  DIABETES  Blood Sugar ranges-98-130  Polyuria- no New Visual problems- no Hypoglycemic symptoms- no Other side effects-no Medication compliance - good Last eye exam- 10/2014 Foot exam- today  HYPERLIPIDEMIA  Medication compliance- good RUQ pain- no  Muscle aches- o Other side effects-no      Review of Systems  Constitutional: Negative for diaphoresis, appetite change, fatigue and unexpected weight change.  Eyes: Negative for pain, redness and visual disturbance.  Respiratory: Negative for cough, chest tightness, shortness of breath and wheezing.   Cardiovascular: Negative for chest pain, palpitations and leg swelling.  Endocrine: Negative for cold intolerance, heat intolerance, polydipsia, polyphagia and polyuria.  Genitourinary: Negative for dysuria, frequency and difficulty urinating.  Neurological: Negative for dizziness, light-headedness, numbness and headaches.  Psychiatric/Behavioral: Negative for dysphoric mood. The patient is nervous/anxious.     History Past Medical History  Diagnosis Date  . Hypercholesterolemia   . Hypertension   . Sinusitis     She has past surgical history that includes Spine surgery (2002); Joint replacement; and Abdominal hysterectomy (1999).   Her family history includes Allergies in her brother, brother, daughter, maternal aunt, and mother; Stroke in her mother.She reports that she has never smoked. She does not have any smokeless tobacco history on file. She reports that she does not drink alcohol or use illicit drugs.  Current Outpatient Prescriptions on File Prior to  Visit  Medication Sig Dispense Refill  . ACCU-CHEK SOFTCLIX LANCETS lancets TEST BLOOD SUGAR ONCE DAILY 100 each 3  . albuterol (PROAIR HFA) 108 (90 BASE) MCG/ACT inhaler Inhale 2 puffs into the lungs every 6 (six) hours as needed for wheezing or shortness of breath. 1 Inhaler 3  . amitriptyline (ELAVIL) 10 MG tablet 1-3 at bedtime as directed for nightmares 31 tablet 5  . amLODipine (NORVASC) 5 MG tablet Take 1 tablet (5 mg total) by mouth daily. 90 tablet 1  . atorvastatin (LIPITOR) 20 MG tablet Take 1 tablet (20 mg total) by mouth daily. 30 tablet 5  . cetirizine (ZYRTEC) 10 MG tablet Take 10 mg by mouth daily.    . cholecalciferol (VITAMIN D) 1000 UNITS tablet Take 1,000 Units by mouth daily.    . cyclobenzaprine (FLEXERIL) 10 MG tablet Take 10 mg by mouth 3 (three) times daily as needed.    . furosemide (LASIX) 40 MG tablet Take 1 tablet (40 mg total) by mouth as needed. 30 tablet 5  . glucose blood (ACCU-CHEK AVIVA PLUS) test strip Check blood sugar once daily 100 each 3  . ipratropium-albuterol (DUONEB) 0.5-2.5 (3) MG/3ML SOLN Take 3 mLs by nebulization every 6 (six) hours as needed.    . metaxalone (SKELAXIN) 800 MG tablet   2  . metFORMIN (GLUCOPHAGE) 500 MG tablet TAKE 1 TABLET BY MOUTH TWICE DAILY WITH A MEAL 180 tablet 1  . methadone (DOLOPHINE) 5 MG tablet   0  . OXYGEN-HELIUM IN Inhale 2 L into the lungs at bedtime.    . Potassium Chloride ER 20 MEQ TBCR   3  . spironolactone (ALDACTONE) 50 MG tablet Take 1 tablet (50  mg total) by mouth daily. 90 tablet 1   No current facility-administered medications on file prior to visit.     Objective:  Objective Physical Exam  Constitutional: She is oriented to person, place, and time. She appears well-developed and well-nourished.  HENT:  Head: Normocephalic and atraumatic.  Eyes: Conjunctivae and EOM are normal.  Neck: Normal range of motion. Neck supple. No JVD present. Carotid bruit is not present. No thyromegaly present.    Cardiovascular: Normal rate, regular rhythm and normal heart sounds.   No murmur heard. Pulmonary/Chest: Effort normal and breath sounds normal. No respiratory distress. She has no wheezes. She has no rales. She exhibits no tenderness.  Musculoskeletal: She exhibits no edema.  Neurological: She is alert and oriented to person, place, and time.  Psychiatric: She has a normal mood and affect. Her behavior is normal. Judgment and thought content normal.  Nursing note and vitals reviewed. Sensory exam of the foot is normal, tested with the monofilament. Good pulses, no lesions or ulcers, good peripheral pulses.  BP 118/70 mmHg  Pulse 52  Temp(Src) 98.2 F (36.8 C) (Oral)  Wt 261 lb 3.2 oz (118.48 kg)  SpO2 98% Wt Readings from Last 3 Encounters:  05/17/15 261 lb 3.2 oz (118.48 kg)  01/17/15 257 lb 6.4 oz (116.756 kg)  11/15/14 272 lb 6.4 oz (123.56 kg)     Lab Results  Component Value Date   WBC 4.5 11/15/2014   HGB 12.0 11/15/2014   HCT 37.8 11/15/2014   PLT 242.0 11/15/2014   GLUCOSE 90 11/15/2014   CHOL 155 11/15/2014   TRIG 67.0 11/15/2014   HDL 50.00 11/15/2014   LDLCALC 92 11/15/2014   ALT 13 11/15/2014   AST 19 11/15/2014   NA 137 11/15/2014   K 3.8 11/15/2014   CL 101 11/15/2014   CREATININE 0.9 11/15/2014   BUN 15 11/15/2014   CO2 26 11/15/2014   TSH 2.51 02/04/2014   HGBA1C 6.7* 11/15/2014   MICROALBUR 1.3 11/15/2014    Dg Chest 2 View  05/18/2014   CLINICAL DATA:  Shortness of breath and cough.  EXAM: CHEST  2 VIEW  COMPARISON:  None.  FINDINGS: Heart size and pulmonary vascularity are normal and the lungs are clear. No infiltrates or effusions. There is slight tortuosity of the thoracic aorta. No osseous abnormality.  IMPRESSION: No acute abnormalities.   Electronically Signed   By: Geanie Cooley M.D.   On: 05/18/2014 15:47     Assessment & Plan:  Plan I have discontinued Ms. Javid's Vitamin D (Ergocalciferol). I am also having her start on Tdap.  Additionally, I am having her maintain her cyclobenzaprine, ipratropium-albuterol, cholecalciferol, cetirizine, albuterol, OXYGEN-HELIUM IN, metaxalone, methadone, Potassium Chloride ER, furosemide, spironolactone, atorvastatin, amLODipine, amitriptyline, glucose blood, ACCU-CHEK SOFTCLIX LANCETS, metFORMIN, sertraline, and ALPRAZolam.  Meds ordered this encounter  Medications  . sertraline (ZOLOFT) 50 MG tablet    Sig: Take 1 tablet (50 mg total) by mouth daily.    Dispense:  90 tablet    Refill:  1    D/C PREVIOUS SCRIPTS FOR THIS MEDICATION  . ALPRAZolam (XANAX) 0.25 MG tablet    Sig: Take 1 tablet (0.25 mg total) by mouth 3 (three) times daily as needed for anxiety or sleep.    Dispense:  30 tablet    Refill:  1  . Tdap (BOOSTRIX) 5-2.5-18.5 LF-MCG/0.5 injection    Sig: Inject 0.5 mLs into the muscle once.    Dispense:  0.5 mL    Refill:  0    Problem List Items Addressed This Visit    None    Visit Diagnoses    Generalized anxiety disorder    -  Primary    Relevant Medications    sertraline (ZOLOFT) 50 MG tablet    ALPRAZolam (XANAX) 0.25 MG tablet    Essential hypertension        Relevant Orders    Microalbumin / creatinine urine ratio    POCT urinalysis dipstick (Completed)    DM II (diabetes mellitus, type II), controlled        Relevant Orders    Basic metabolic panel    Hemoglobin A1c    Hyperlipidemia        Relevant Orders    Hepatic function panel    Lipid panel    Microalbumin / creatinine urine ratio    POCT urinalysis dipstick (Completed)    Need for Tdap vaccination        Relevant Medications    Tdap (BOOSTRIX) 5-2.5-18.5 LF-MCG/0.5 injection       Follow-up: Return in about 6 months (around 11/16/2015), or if symptoms worsen or fail to improve, for hypertension, hyperlipidemia, diabetes II.  Loreen Freud, DO

## 2015-05-17 NOTE — Progress Notes (Signed)
Pre visit review using our clinic review tool, if applicable. No additional management support is needed unless otherwise documented below in the visit note. 

## 2015-05-17 NOTE — Patient Instructions (Signed)

## 2015-06-20 ENCOUNTER — Other Ambulatory Visit: Payer: Self-pay | Admitting: Family Medicine

## 2015-07-11 ENCOUNTER — Ambulatory Visit: Payer: Medicare Other | Admitting: Internal Medicine

## 2015-07-18 ENCOUNTER — Ambulatory Visit: Payer: Medicare Other | Admitting: Internal Medicine

## 2015-11-11 ENCOUNTER — Telehealth: Payer: Self-pay | Admitting: Internal Medicine

## 2015-11-11 NOTE — Telephone Encounter (Signed)
[facility], states they need a faxed form by tomorrow that was faxed on 12/13.  Verified fax #, the original form was faxed to the wrong fax #.  Gave corrected fax.  Will look out for form.

## 2015-11-14 NOTE — Telephone Encounter (Signed)
Katie please advise if you have received any forms for O2 on this patient from [facility]. Thanks.

## 2015-11-14 NOTE — Telephone Encounter (Signed)
Forms were on my desk this morning from [facility]; they have been faxed back. Thanks.

## 2015-11-14 NOTE — Telephone Encounter (Signed)
Called [facility] and spoke with Crystal who reported she had not yet received the fax.  She asked that I refax it to: [phone].  This has been done.    Nothing further needed; will sign off.

## 2015-11-17 ENCOUNTER — Ambulatory Visit (INDEPENDENT_AMBULATORY_CARE_PROVIDER_SITE_OTHER): Payer: Medicare Other | Admitting: Internal Medicine

## 2015-11-17 ENCOUNTER — Encounter: Payer: Self-pay | Admitting: Family Medicine

## 2015-11-17 ENCOUNTER — Encounter: Payer: Self-pay | Admitting: Internal Medicine

## 2015-11-17 ENCOUNTER — Ambulatory Visit (INDEPENDENT_AMBULATORY_CARE_PROVIDER_SITE_OTHER): Payer: Medicare Other | Admitting: Family Medicine

## 2015-11-17 VITALS — BP 112/76 | HR 50 | Ht 62.0 in | Wt 254.6 lb

## 2015-11-17 VITALS — BP 122/84 | HR 66 | Temp 98.0°F | Ht 62.0 in | Wt 256.4 lb

## 2015-11-17 DIAGNOSIS — E1165 Type 2 diabetes mellitus with hyperglycemia: Secondary | ICD-10-CM

## 2015-11-17 DIAGNOSIS — I10 Essential (primary) hypertension: Secondary | ICD-10-CM | POA: Diagnosis not present

## 2015-11-17 DIAGNOSIS — IMO0002 Reserved for concepts with insufficient information to code with codable children: Secondary | ICD-10-CM | POA: Insufficient documentation

## 2015-11-17 DIAGNOSIS — E1151 Type 2 diabetes mellitus with diabetic peripheral angiopathy without gangrene: Secondary | ICD-10-CM

## 2015-11-17 DIAGNOSIS — J4520 Mild intermittent asthma, uncomplicated: Secondary | ICD-10-CM

## 2015-11-17 DIAGNOSIS — E662 Morbid (severe) obesity with alveolar hypoventilation: Secondary | ICD-10-CM

## 2015-11-17 DIAGNOSIS — Z23 Encounter for immunization: Secondary | ICD-10-CM

## 2015-11-17 DIAGNOSIS — E785 Hyperlipidemia, unspecified: Secondary | ICD-10-CM

## 2015-11-17 DIAGNOSIS — M62838 Other muscle spasm: Secondary | ICD-10-CM | POA: Diagnosis not present

## 2015-11-17 DIAGNOSIS — Z114 Encounter for screening for human immunodeficiency virus [HIV]: Secondary | ICD-10-CM

## 2015-11-17 DIAGNOSIS — Z1159 Encounter for screening for other viral diseases: Secondary | ICD-10-CM

## 2015-11-17 LAB — HIV ANTIBODY (ROUTINE TESTING W REFLEX): HIV: NONREACTIVE

## 2015-11-17 MED ORDER — METFORMIN HCL 500 MG PO TABS: ORAL_TABLET | ORAL | Status: DC

## 2015-11-17 MED ORDER — CYCLOBENZAPRINE HCL 10 MG PO TABS: 10.0000 mg | ORAL_TABLET | Freq: Three times a day (TID) | ORAL | Status: DC | PRN

## 2015-11-17 MED ORDER — ALBUTEROL SULFATE HFA 108 (90 BASE) MCG/ACT IN AERS: 2.0000 | INHALATION_SPRAY | Freq: Four times a day (QID) | RESPIRATORY_TRACT | Status: DC | PRN

## 2015-11-17 MED ORDER — AMLODIPINE BESYLATE 5 MG PO TABS: 5.0000 mg | ORAL_TABLET | Freq: Every day | ORAL | Status: DC

## 2015-11-17 MED ORDER — ATORVASTATIN CALCIUM 20 MG PO TABS: 20.0000 mg | ORAL_TABLET | Freq: Every day | ORAL | Status: DC

## 2015-11-17 MED ORDER — SPIRONOLACTONE 50 MG PO TABS: 50.0000 mg | ORAL_TABLET | Freq: Every day | ORAL | Status: DC

## 2015-11-17 NOTE — Progress Notes (Signed)
03/09/14- 75 yoF never smoker Dr Etter Sjogren- concerned about sleep apnea and facial swelling. NPSG 05/11/12-[facility]-  Mild obstructive sleep apnea, AHI 14.9 per hour with body weight 288 pounds.PLMI 5.3/ hr. CPAP titration 06/03/12 to 11. She reports snoring for years. Sleeps in a recliner. Bedtime between 1 AM and 3 AM "could stay up all night", getting up 4 times before up in the morning between 10 AM and 11 AM. Says she has had 3 sleep studies but "never needed CPAP".  Lying down, feels stuffy in the nose. For 7 years has been experiencing periorbital edema. Had facial rash with negative workup for lupus. Gets sinus congestion treated with nose sprays, daily frontal headaches, joint pains and muscle spasms. Skin testing a year ago elsewhere reported negative. No ENT surgery. Facial swelling but better on a prednisone taper. Denies tongue swelling, hives or asthma. Labs 02/04/2014-sedimentation rate 31, ANA negative, rheumatoid factor normal, C4 complement and C1esterace negative. Divorced, living alone.  05/18/14- 32 yoF never smoker Dr Etter Sjogren- concerned about sleep apnea and facial swelling FOLLOWS FOR: Pt reports good tolerance of O2 2L/  at night with sleep. Uses also during naps. Pt states that she still has difficulty sleeping. Pt c/o nightmares--pt explains this as recurrent nightmares anytime she sleeps.  Daughter here With oxygen during sleep she no longer wakes with headaches but she still has frequent nightmares attributed in color screening. She is concerned about very limited exercise tolerance-exhausted by using bathroom and says she has to go lie down. She again complains of facial soft tissue swelling. CT of the face showed prominent soft tissue swelling without sinus disease. She has been taking spironolactone, has Lasix but has not been taking it regularly. Labs reviewed. Allergy profile- 03/11/14- Total IgE 863.9, + dust, box elder ONOX 03/11/14 qualified for sleep O2 2L CT max fac  03/12/14 FINDINGS:  There is no significant maxillary, ethmoid, frontal, or sphenoid  sinus fluid. Bilateral preseptal periorbital soft tissue swelling is  present. Bilateral malar soft tissue swelling is present. There is  no focal abscess. No mandibular or maxillary periapical abscess is  seen. No nasal cavity masses. No retropharyngeal soft tissue  2/swelling. Prior cervical fusion. Visualized intracranial compartment  unremarkable.  IMPRESSION:  Facial swelling without obvious cause.  Electronically Signed  By: Rolla Flatten M.D.  On: 03/12/2014 16:30  11/14/14-  65 yoF never smoker Dr Etter Sjogren- concerned about sleep apnea and facial swelling O2 2L sleep. Daughter here She has lost some weight taking Lasix every other day and continue spironolactone. She remains very heavy. Amitriptyline 10 mg did not change her nightmares. She is also taking Zoloft 50 mg now in the morning, instead of Celexa. CXR 05/18/2014-normal heart size, clear lungs. She continues oxygen 2 L, saying that she did not tolerate CPAP. We talked about considering another trial.  01/17/15- 63 yoF never smoker Dr Etter Sjogren- concerned about sleep apnea and facial swelling O2 2L sleep. Daughter here Patient says oxygen for sleep "really helps" but she could never make CPAP work for her. Always a slow starter in the mornings but good later on. Still occasional nightmares but not as much. Oxygen seems to reduce that. Amitriptyline at bedtime has also helped. She is gradually dieting her weight down, so far 42 pounds down from 299 pounds we first met. She is congratulated on this effort.  11/17/2015-64 year old female never smoker followed for OSA, nightmares, history facial swelling, complicated by morbid obesity Failed to tolerate CPAP FOLOWS FOR: Using 2 Liters O2  at night with sleep. No complaints. Pt wants flu vaccine.   ROS-see HPI Constitutional:   +  weight loss, night sweats, fevers, chills, fatigue, lassitude. HEENT:    + headaches, no-difficulty swallowing, tooth/dental problems, sore throat,       No-  sneezing, itching, ear ache, +nasal congestion, post nasal drip,  CV:  No-   chest pain, orthopnea, PND, swelling in lower extremities, anasarca,                                                       dizziness, palpitations Resp: +shortness of breath with exertion or at rest.              No-   productive cough,  No non-productive cough,  No- coughing up of blood.              No-   change in color of mucus.  No- wheezing.   Skin: No-   rash or lesions. GI:  No-   heartburn, indigestion, abdominal pain, nausea, vomiting,  GU:  MS:  No-   joint pain or swelling.   Neuro-     nothing unusual Psych:  No- change in mood or affect. No depression or anxiety.  No memory loss.  OBJ- Physical Exam General- Alert, Oriented, Affect-appropriate, Distress- none acute,+very overweight Skin- rash-none, lesions- none, excoriation- none Lymphadenopathy- none Head- atraumatic            Eyes- Gross vision intact, PERRLA, conjunctivae and secretions clear,                    + periorbital  edema            Ears- Hearing, canals-normal            Nose- Clear, no-Septal dev, mucus, polyps, erosion, perforation             Throat- Mallampati II , mucosa clear , drainage- none, tonsils+ present Neck- flexible , trachea midline, no stridor , thyroid nl, carotid no bruit Chest - symmetrical excursion , unlabored           Heart/CV- RRR , no murmur , no gallop  , no rub, nl s1 s2                           - JVD- none , edema- none, stasis changes- none, varices- none           Lung- clear to P&A, wheeze- none, cough- none , dullness-none, rub- none           Chest wall-  Abd-  Br/ Gen/ Rectal- Not done, not indicated Extrem- cyanosis- none, clubbing, none, atrophy- none, strength- nl.+fat ankles with generalized nonspecific mild tenderness Neuro- grossly intact to observation

## 2015-11-17 NOTE — Progress Notes (Signed)
Pre visit review using our clinic review tool, if applicable. No additional management support is needed unless otherwise documented below in the visit note. 

## 2015-11-17 NOTE — Assessment & Plan Note (Signed)
Controlled per pt Check labs

## 2015-11-17 NOTE — Patient Instructions (Signed)
Script refilling Proair rescue inhaler  Order- office spirometry      Dx asthma mild intermittent  Order- ONOX on room air ( [facility])   Dx obesity hypoventilation, asthma  Flu vax

## 2015-11-17 NOTE — Assessment & Plan Note (Signed)
Stable  con't norvasc 

## 2015-11-17 NOTE — Progress Notes (Signed)
Patient ID: Amy Mcgee, female    DOB: May 07, 1951  Age: 64 y.o. MRN: [id number]    Subjective:  Subjective HPI Amy Mcgee presents for f/u dm, cholesterol and htn.   HPI HYPERTENSION  Blood pressure range---no checking   Chest pain- no      Dyspnea- no Lightheadedness- no   Edema- no Other side effects - no   Medication compliance: good Low salt diet- yes  DIABETES  Blood Sugar ranges--80-90s   Polyuria- no New Visual problems- no Hypoglycemic symptoms- no Other side effects-no Medication compliance - good Last eye exam- due Foot exam- today  HYPERLIPIDEMIA  Medication compliance- good RUQ pain- no  Muscle aches- no Other side effects-no   Review of Systems  Constitutional: Negative for diaphoresis, appetite change, fatigue and unexpected weight change.  Eyes: Negative for pain, redness and visual disturbance.  Respiratory: Negative for cough, chest tightness, shortness of breath and wheezing.   Cardiovascular: Negative for chest pain, palpitations and leg swelling.  Endocrine: Negative for cold intolerance, heat intolerance, polydipsia, polyphagia and polyuria.  Genitourinary: Negative for dysuria, frequency and difficulty urinating.  Neurological: Negative for dizziness, light-headedness, numbness and headaches.    History Past Medical History  Diagnosis Date  . Hypercholesterolemia   . Hypertension   . Sinusitis     She has past surgical history that includes Spine surgery (2002); Joint replacement; and Abdominal hysterectomy (1999).   Her family history includes Allergies in her brother, brother, daughter, maternal aunt, and mother; Stroke in her mother.She reports that she has never smoked. She does not have any smokeless tobacco history on file. She reports that she does not drink alcohol or use illicit drugs.  Current Outpatient Prescriptions on File Prior to Visit  Medication Sig Dispense Refill  . ACCU-CHEK SOFTCLIX LANCETS lancets TEST BLOOD  SUGAR ONCE DAILY 100 each 3  . ALPRAZolam (XANAX) 0.25 MG tablet Take 1 tablet (0.25 mg total) by mouth 3 (three) times daily as needed for anxiety or sleep. 30 tablet 1  . amitriptyline (ELAVIL) 10 MG tablet 1-3 at bedtime as directed for nightmares 31 tablet 5  . cetirizine (ZYRTEC) 10 MG tablet Take 10 mg by mouth daily.    . cholecalciferol (VITAMIN D) 1000 UNITS tablet Take 1,000 Units by mouth daily.    . furosemide (LASIX) 40 MG tablet Take 1 tablet (40 mg total) by mouth as needed. 30 tablet 5  . glucose blood (ACCU-CHEK AVIVA PLUS) test strip Check blood sugar once daily 100 each 3  . ipratropium-albuterol (DUONEB) 0.5-2.5 (3) MG/3ML SOLN Take 3 mLs by nebulization every 6 (six) hours as needed.    . metaxalone (SKELAXIN) 800 MG tablet   2  . methadone (DOLOPHINE) 5 MG tablet   0  . OXYGEN-HELIUM IN Inhale 2 L into the lungs at bedtime.    . Potassium Chloride ER 20 MEQ TBCR   3  . sertraline (ZOLOFT) 50 MG tablet Take 1 tablet (50 mg total) by mouth daily. 90 tablet 1   No current facility-administered medications on file prior to visit.     Objective:  Objective Physical Exam  Constitutional: She is oriented to person, place, and time. She appears well-developed and well-nourished.  HENT:  Head: Normocephalic and atraumatic.  Eyes: Conjunctivae and EOM are normal.  Neck: Normal range of motion. Neck supple. No JVD present. Carotid bruit is not present. No thyromegaly present.  Cardiovascular: Normal rate, regular rhythm and normal heart sounds.   No murmur heard. Pulmonary/Chest: Effort  normal and breath sounds normal. No respiratory distress. She has no wheezes. She has no rales. She exhibits no tenderness.  Musculoskeletal: She exhibits no edema.  Neurological: She is alert and oriented to person, place, and time.  Psychiatric: She has a normal mood and affect. Her behavior is normal.  Nursing note and vitals reviewed. Sensory exam of the foot is normal, tested with the  monofilament. Good pulses, no lesions or ulcers, good peripheral pulses.  BP 122/84 mmHg  Pulse 66  Temp(Src) 98 F (36.7 C) (Oral)  Ht 5' 2" (1.575 m)  Wt 256 lb 6.4 oz (116.302 kg)  BMI 46.88 kg/m2  SpO2 97% Wt Readings from Last 3 Encounters:  11/17/15 256 lb 6.4 oz (116.302 kg)  11/17/15 254 lb 9.6 oz (115.486 kg)  05/17/15 261 lb 3.2 oz (118.48 kg)     Lab Results  Component Value Date   WBC 4.5 11/15/2014   HGB 12.0 11/15/2014   HCT 37.8 11/15/2014   PLT 242.0 11/15/2014   GLUCOSE 79 05/17/2015   CHOL 155 05/17/2015   TRIG 63.0 05/17/2015   HDL 55.60 05/17/2015   LDLCALC 87 05/17/2015   ALT 11 05/17/2015   AST 17 05/17/2015   NA 138 05/17/2015   K 3.6 05/17/2015   CL 103 05/17/2015   CREATININE 0.88 05/17/2015   BUN 13 05/17/2015   CO2 29 05/17/2015   TSH 2.51 02/04/2014   HGBA1C 6.3 05/17/2015   MICROALBUR 0.7 05/17/2015    Dg Chest 2 View  05/18/2014  CLINICAL DATA:  Shortness of breath and cough. EXAM: CHEST  2 VIEW COMPARISON:  None. FINDINGS: Heart size and pulmonary vascularity are normal and the lungs are clear. No infiltrates or effusions. There is slight tortuosity of the thoracic aorta. No osseous abnormality. IMPRESSION: No acute abnormalities. Electronically Signed   By: Rozetta Nunnery M.D.   On: 05/18/2014 15:47     Assessment & Plan:  Plan I have discontinued Amy Mcgee's Tdap. I have also changed her cyclobenzaprine and atorvastatin. Additionally, I am having her maintain her ipratropium-albuterol, cholecalciferol, cetirizine, OXYGEN-HELIUM IN, metaxalone, methadone, Potassium Chloride ER, furosemide, amitriptyline, glucose blood, ACCU-CHEK SOFTCLIX LANCETS, sertraline, ALPRAZolam, albuterol, amLODipine, metFORMIN, and spironolactone.  Meds ordered this encounter  Medications  . cyclobenzaprine (FLEXERIL) 10 MG tablet    Sig: Take 1 tablet (10 mg total) by mouth 3 (three) times daily as needed.    Dispense:  30 tablet    Refill:  2  .  amLODipine (NORVASC) 5 MG tablet    Sig: Take 1 tablet (5 mg total) by mouth daily.    Dispense:  90 tablet    Refill:  1  . atorvastatin (LIPITOR) 20 MG tablet    Sig: Take 1 tablet (20 mg total) by mouth daily.    Dispense:  30 tablet    Refill:  5  . metFORMIN (GLUCOPHAGE) 500 MG tablet    Sig: TAKE 1 TABLET BY MOUTH TWICE DAILY WITH A MEAL    Dispense:  180 tablet    Refill:  1  . spironolactone (ALDACTONE) 50 MG tablet    Sig: Take 1 tablet (50 mg total) by mouth daily.    Dispense:  90 tablet    Refill:  1    Problem List Items Addressed This Visit      Unprioritized   HTN (hypertension)    Stable  con't norvasc      Relevant Medications   amLODipine (NORVASC) 5 MG tablet  atorvastatin (LIPITOR) 20 MG tablet   spironolactone (ALDACTONE) 50 MG tablet   Other Relevant Orders   Comp Met (CMET)   Hemoglobin A1c   Lipid panel   DM (diabetes mellitus) type II controlled peripheral vascular disorder (HCC)    Controlled per pt Check labs      Relevant Medications   amLODipine (NORVASC) 5 MG tablet   atorvastatin (LIPITOR) 20 MG tablet   metFORMIN (GLUCOPHAGE) 500 MG tablet   spironolactone (ALDACTONE) 50 MG tablet   Other Relevant Orders   Comp Met (CMET)   Hemoglobin A1c   Lipid panel    Other Visit Diagnoses    Muscle spasm    -  Primary    Relevant Medications    cyclobenzaprine (FLEXERIL) 10 MG tablet    Hyperlipidemia        Relevant Medications    amLODipine (NORVASC) 5 MG tablet    atorvastatin (LIPITOR) 20 MG tablet    spironolactone (ALDACTONE) 50 MG tablet    Other Relevant Orders    Comp Met (CMET)    Hemoglobin A1c    Lipid panel    Need for hepatitis C screening test        Relevant Orders    Hepatitis C antibody    Screening for HIV (human immunodeficiency virus)        Relevant Orders    HIV antibody       Follow-up: Return in about 6 months (around 05/17/2016), or if symptoms worsen or fail to improve, for annual exam,  fasting.  Garnet Koyanagi, DO

## 2015-11-17 NOTE — Patient Instructions (Signed)

## 2015-11-18 ENCOUNTER — Telehealth: Payer: Self-pay | Admitting: Internal Medicine

## 2015-11-18 LAB — HEPATITIS C ANTIBODY: HCV Ab: NEGATIVE

## 2015-11-18 LAB — LIPID PANEL
CHOL/HDL RATIO: 3
CHOLESTEROL: 169 mg/dL (ref 0–200)
HDL: 56.5 mg/dL (ref >39.00)
LDL CALC: 94 mg/dL (ref 0–99)
NONHDL: 112.18
Triglycerides: 93 mg/dL (ref 0.0–149.0)
VLDL: 18.6 mg/dL (ref 0.0–40.0)

## 2015-11-18 LAB — COMPREHENSIVE METABOLIC PANEL
ALT: 14 U/L (ref 0–35)
AST: 20 U/L (ref 0–37)
Albumin: 3.5 g/dL (ref 3.5–5.2)
Alkaline Phosphatase: 111 U/L (ref 39–117)
BUN: 17 mg/dL (ref 6–23)
CO2: 31 meq/L (ref 19–32)
CREATININE: 0.97 mg/dL (ref 0.40–1.20)
Calcium: 9.2 mg/dL (ref 8.4–10.5)
Chloride: 103 mEq/L (ref 96–112)
GFR: 74.13 mL/min (ref >60.00)
Glucose, Bld: 106 mg/dL — ABNORMAL HIGH (ref 70–99)
Potassium: 3.3 mEq/L — ABNORMAL LOW (ref 3.5–5.1)
SODIUM: 141 meq/L (ref 135–145)
Total Bilirubin: 0.7 mg/dL (ref 0.2–1.2)
Total Protein: 7.1 g/dL (ref 6.0–8.3)

## 2015-11-18 LAB — HEMOGLOBIN A1C: Hgb A1c MFr Bld: 6.4 % (ref 4.6–6.5)

## 2015-11-18 NOTE — Telephone Encounter (Signed)
Notes Recorded by Tommie SamsMindy S Silva, CMA on 11/18/2015 at 10:20 AM lmtcb x1 Notes Recorded by Waymon Budgelinton D Young, MD on 11/17/2015 at 8:17 PM Office spirometry- only very slight airflow slowing. Basically normal ---------------------- Pt is aware. Nothing further was needed.

## 2015-12-21 ENCOUNTER — Encounter: Payer: Self-pay | Admitting: Internal Medicine

## 2016-02-03 ENCOUNTER — Other Ambulatory Visit: Payer: Self-pay | Admitting: Family Medicine

## 2016-04-17 ENCOUNTER — Telehealth: Payer: Self-pay

## 2016-04-17 NOTE — Telephone Encounter (Signed)
Patient is on my [employer] list for 2017. Pt may be a good candidate for an AWV for 2017 

## 2016-04-18 NOTE — Telephone Encounter (Signed)
Pt declined AWV with nurse at this time. Just wants regular appt with her doctor.

## 2016-04-18 NOTE — Telephone Encounter (Signed)
Noted  

## 2016-04-26 HISTORY — PX: CARPAL TUNNEL RELEASE: SHX101

## 2016-05-22 ENCOUNTER — Ambulatory Visit: Payer: Medicare Other | Admitting: Family Medicine

## 2016-06-26 ENCOUNTER — Other Ambulatory Visit: Payer: Self-pay | Admitting: Family Medicine

## 2016-06-26 DIAGNOSIS — E785 Hyperlipidemia, unspecified: Secondary | ICD-10-CM

## 2016-06-26 DIAGNOSIS — F411 Generalized anxiety disorder: Secondary | ICD-10-CM

## 2016-06-26 NOTE — Telephone Encounter (Signed)
Last seen 11/17/15 and filled 05/17/15 #30 with 1 rf   Please advise    KP

## 2016-07-02 ENCOUNTER — Other Ambulatory Visit: Payer: Self-pay | Admitting: Family Medicine

## 2016-07-02 DIAGNOSIS — I10 Essential (primary) hypertension: Secondary | ICD-10-CM

## 2016-08-29 ENCOUNTER — Other Ambulatory Visit: Payer: Self-pay | Admitting: Family Medicine

## 2016-08-29 DIAGNOSIS — E785 Hyperlipidemia, unspecified: Secondary | ICD-10-CM

## 2016-09-29 ENCOUNTER — Other Ambulatory Visit: Payer: Self-pay | Admitting: Family Medicine

## 2016-09-29 DIAGNOSIS — I10 Essential (primary) hypertension: Secondary | ICD-10-CM

## 2016-10-01 NOTE — Telephone Encounter (Signed)
Medication filled to pharmacy as requested.   

## 2016-10-05 ENCOUNTER — Other Ambulatory Visit: Payer: Self-pay | Admitting: Family Medicine

## 2016-10-05 DIAGNOSIS — I10 Essential (primary) hypertension: Secondary | ICD-10-CM

## 2016-10-05 DIAGNOSIS — E785 Hyperlipidemia, unspecified: Secondary | ICD-10-CM

## 2016-11-16 ENCOUNTER — Ambulatory Visit: Payer: Medicare Other | Admitting: Internal Medicine

## 2016-11-21 ENCOUNTER — Ambulatory Visit: Payer: Medicare Other | Admitting: Internal Medicine

## 2016-11-22 ENCOUNTER — Ambulatory Visit: Payer: Medicare Other | Admitting: Family Medicine

## 2016-12-04 ENCOUNTER — Telehealth: Payer: Self-pay | Admitting: Family Medicine

## 2016-12-04 NOTE — Telephone Encounter (Signed)
Called patient to schedule awv. Left msg for patient to schedule appt.

## 2016-12-10 ENCOUNTER — Telehealth: Payer: Self-pay | Admitting: *Deleted

## 2016-12-10 ENCOUNTER — Ambulatory Visit (INDEPENDENT_AMBULATORY_CARE_PROVIDER_SITE_OTHER): Payer: Medicare Other | Admitting: Family Medicine

## 2016-12-10 ENCOUNTER — Other Ambulatory Visit (INDEPENDENT_AMBULATORY_CARE_PROVIDER_SITE_OTHER): Payer: Medicare Other

## 2016-12-10 ENCOUNTER — Encounter: Payer: Self-pay | Admitting: Internal Medicine

## 2016-12-10 ENCOUNTER — Encounter: Payer: Self-pay | Admitting: Family Medicine

## 2016-12-10 ENCOUNTER — Ambulatory Visit (INDEPENDENT_AMBULATORY_CARE_PROVIDER_SITE_OTHER): Payer: Medicare Other | Admitting: Internal Medicine

## 2016-12-10 ENCOUNTER — Ambulatory Visit (INDEPENDENT_AMBULATORY_CARE_PROVIDER_SITE_OTHER)
Admission: RE | Admit: 2016-12-10 | Discharge: 2016-12-10 | Disposition: A | Discharge status: Home / Self Care | Payer: Medicare Other | Source: Ambulatory Visit | Attending: Internal Medicine | Admitting: Internal Medicine

## 2016-12-10 ENCOUNTER — Ambulatory Visit: Payer: Medicare Other | Admitting: *Deleted

## 2016-12-10 VITALS — BP 130/76 | HR 59 | Temp 97.9°F | Resp 18 | Ht 63.0 in | Wt 273.6 lb

## 2016-12-10 VITALS — BP 114/76 | HR 58 | Ht 62.0 in | Wt 272.6 lb

## 2016-12-10 DIAGNOSIS — R0600 Dyspnea, unspecified: Secondary | ICD-10-CM

## 2016-12-10 DIAGNOSIS — E662 Morbid (severe) obesity with alveolar hypoventilation: Secondary | ICD-10-CM | POA: Diagnosis not present

## 2016-12-10 DIAGNOSIS — Z0000 Encounter for general adult medical examination without abnormal findings: Secondary | ICD-10-CM | POA: Diagnosis not present

## 2016-12-10 DIAGNOSIS — J9611 Chronic respiratory failure with hypoxia: Secondary | ICD-10-CM

## 2016-12-10 DIAGNOSIS — F418 Other specified anxiety disorders: Secondary | ICD-10-CM

## 2016-12-10 DIAGNOSIS — I10 Essential (primary) hypertension: Secondary | ICD-10-CM

## 2016-12-10 DIAGNOSIS — F515 Nightmare disorder: Secondary | ICD-10-CM

## 2016-12-10 DIAGNOSIS — E876 Hypokalemia: Secondary | ICD-10-CM

## 2016-12-10 DIAGNOSIS — E1151 Type 2 diabetes mellitus with diabetic peripheral angiopathy without gangrene: Secondary | ICD-10-CM | POA: Diagnosis not present

## 2016-12-10 DIAGNOSIS — Z1231 Encounter for screening mammogram for malignant neoplasm of breast: Secondary | ICD-10-CM | POA: Diagnosis not present

## 2016-12-10 DIAGNOSIS — Z1239 Encounter for other screening for malignant neoplasm of breast: Secondary | ICD-10-CM

## 2016-12-10 DIAGNOSIS — E785 Hyperlipidemia, unspecified: Secondary | ICD-10-CM

## 2016-12-10 DIAGNOSIS — G4733 Obstructive sleep apnea (adult) (pediatric): Secondary | ICD-10-CM

## 2016-12-10 DIAGNOSIS — F411 Generalized anxiety disorder: Secondary | ICD-10-CM

## 2016-12-10 LAB — HEPATIC FUNCTION PANEL
ALBUMIN: 3.5 g/dL (ref 3.5–5.2)
ALT: 20 U/L (ref 0–35)
AST: 22 U/L (ref 0–37)
Alkaline Phosphatase: 162 U/L — ABNORMAL HIGH (ref 39–117)
Bilirubin, Direct: 0.2 mg/dL (ref 0.0–0.3)
Total Bilirubin: 0.9 mg/dL (ref 0.2–1.2)
Total Protein: 7.1 g/dL (ref 6.0–8.3)

## 2016-12-10 LAB — CBC WITH DIFFERENTIAL/PLATELET
BASOS PCT: 0.5 % (ref 0.0–3.0)
Basophils Absolute: 0 10*3/uL (ref 0.0–0.1)
EOS PCT: 2.6 % (ref 0.0–5.0)
Eosinophils Absolute: 0.1 10*3/uL (ref 0.0–0.7)
HEMATOCRIT: 38.6 % (ref 36.0–46.0)
HEMOGLOBIN: 12.9 g/dL (ref 12.0–15.0)
LYMPHS PCT: 34.6 % (ref 12.0–46.0)
Lymphs Abs: 1.7 10*3/uL (ref 0.7–4.0)
MCHC: 33.3 g/dL (ref 30.0–36.0)
MCV: 84.3 fl (ref 78.0–100.0)
Monocytes Absolute: 0.5 10*3/uL (ref 0.1–1.0)
Monocytes Relative: 9.9 % (ref 3.0–12.0)
Neutro Abs: 2.6 10*3/uL (ref 1.4–7.7)
Neutrophils Relative %: 52.4 % (ref 43.0–77.0)
Platelets: 261 10*3/uL (ref 150.0–400.0)
RBC: 4.58 Mil/uL (ref 3.87–5.11)
RDW: 14.2 % (ref 11.5–15.5)
WBC: 5 10*3/uL (ref 4.0–10.5)

## 2016-12-10 LAB — BASIC METABOLIC PANEL
BUN: 15 mg/dL (ref 6–23)
CALCIUM: 9.4 mg/dL (ref 8.4–10.5)
CO2: 30 meq/L (ref 19–32)
Chloride: 105 mEq/L (ref 96–112)
Creatinine, Ser: 0.77 mg/dL (ref 0.40–1.20)
GFR: 96.45 mL/min (ref >60.00)
GLUCOSE: 91 mg/dL (ref 70–99)
Potassium: 4.5 mEq/L (ref 3.5–5.1)
Sodium: 140 mEq/L (ref 135–145)

## 2016-12-10 MED ORDER — AMITRIPTYLINE HCL 10 MG PO TABS: ORAL_TABLET | ORAL | 12 refills | Status: AC

## 2016-12-10 MED ORDER — ALPRAZOLAM 0.25 MG PO TABS: ORAL_TABLET | ORAL | 5 refills | Status: AC

## 2016-12-10 MED ORDER — POTASSIUM CHLORIDE ER 20 MEQ PO TBCR: EXTENDED_RELEASE_TABLET | ORAL | 3 refills | Status: AC

## 2016-12-10 MED ORDER — ATORVASTATIN CALCIUM 20 MG PO TABS: 20.0000 mg | ORAL_TABLET | Freq: Every day | ORAL | 1 refills | Status: DC

## 2016-12-10 MED ORDER — AMLODIPINE BESYLATE 5 MG PO TABS: ORAL_TABLET | ORAL | 1 refills | Status: DC

## 2016-12-10 MED ORDER — METFORMIN HCL 500 MG PO TABS: ORAL_TABLET | ORAL | 1 refills | Status: DC

## 2016-12-10 MED ORDER — SERTRALINE HCL 100 MG PO TABS: 100.0000 mg | ORAL_TABLET | Freq: Every day | ORAL | 3 refills | Status: DC

## 2016-12-10 MED ORDER — FUROSEMIDE 40 MG PO TABS: 40.0000 mg | ORAL_TABLET | ORAL | 1 refills | Status: AC | PRN

## 2016-12-10 MED ORDER — ALBUTEROL SULFATE HFA 108 (90 BASE) MCG/ACT IN AERS: 2.0000 | INHALATION_SPRAY | Freq: Four times a day (QID) | RESPIRATORY_TRACT | 11 refills | Status: AC | PRN

## 2016-12-10 MED ORDER — SPIRONOLACTONE 50 MG PO TABS: ORAL_TABLET | ORAL | 1 refills | Status: DC

## 2016-12-10 NOTE — Patient Instructions (Addendum)
Refill scripts printed for alprazolam, amitriptyline, ProAir inhaler  Flu vax senior  Staff- Please contract [facility]([place]) for result of ONOX room air done about a year ago  Order- CXR-   Dyspnea, hypoxic respiratory failure  Order- Office spirometry  Order-  Lab- CBC w diff, BMET, hepatic profile  be available for PCP visit upcoming  Please call as needed

## 2016-12-10 NOTE — Telephone Encounter (Signed)
Appt 3/30

## 2016-12-10 NOTE — Progress Notes (Signed)
Subjective:   Amy Mcgee is a 66 y.o. female who presents for Medicare Annual (Subsequent) preventive examination.  Review of Systems:  No ROS.  Medicare Wellness Visit.  Cardiac Risk Factors include: advanced age (>34men, >25 women);diabetes mellitus;dyslipidemia;hypertension;sedentary lifestyle;obesity (BMI >30kg/m2)  Sleep patterns: has interrupted sleep and is not rested upon awakening.   Home Safety/Smoke Alarms: Feels safe in home. Smoke alarms in place.   Living environment; residence: Lives w/ her mother and helps take care of her. 1-story house/ trailer, equipment: Medical laboratory scientific officer, Type: Single [employer], Type: Tub Dentist. Seat Belt Safety/Bike Helmet: Wears seat belt.   Counseling:   Eye Exam- Dr. Delford Field yearly. Dental- Does not follow w/ dentist regularly. Does not wear dentures.  Female:   Pap- Aged out.      Mammo- last 10/13/13. BI-RADS Category 1: Negative.   Dexa scan- Not on file.       CCS- last 02/05/12. Pt reported, no report on file.     Objective:     Vitals: BP 130/76 (BP Location: Right Arm, Patient Position: Sitting, Cuff Size: Large)   Pulse (!) 59   Temp 97.9 F (36.6 C) (Oral)   Resp 18   Ht 5\' 3"  (1.6 m)   Wt 273 lb 9.6 oz (124.1 kg)   SpO2 99%   BMI 48.47 kg/m   Body mass index is 48.47 kg/m.   Tobacco History  Smoking Status  . Never Smoker  Smokeless Tobacco  . Never Used     Counseling given: Not Answered   Past Medical History:  Diagnosis Date  . Diabetes mellitus without complication (HCC)   . Hypercholesterolemia   . Hypertension   . Sinusitis    Past Surgical History:  Procedure Laterality Date  . ABDOMINAL HYSTERECTOMY  1999  . CARPAL TUNNEL RELEASE Left 04/2016  . JOINT REPLACEMENT     right 2008 and left 2003  knee  . SPINE SURGERY  2002   cervical   Family History  Problem Relation Age of Onset  . Allergies Mother   . Stroke Mother   . Allergies Maternal Aunt   . Allergies Brother   .  Allergies Brother   . Allergies Daughter    History  Sexual Activity  . Sexual activity: No    Outpatient Encounter Prescriptions as of 12/10/2016  Medication Sig  . ACCU-CHEK SOFTCLIX LANCETS lancets TEST BLOOD SUGAR ONCE DAILY  . albuterol (PROAIR HFA) 108 (90 Base) MCG/ACT inhaler Inhale 2 puffs into the lungs every 6 (six) hours as needed for wheezing or shortness of breath.  . ALPRAZolam (XANAX) 0.25 MG tablet TAKE 1 TABLET BY MOUTH THREE TIMES DAILY AS NEEDED FOR ANXIETY OR SLEEP  . amitriptyline (ELAVIL) 10 MG tablet 1-3 at bedtime as directed for nightmares  . amLODipine (NORVASC) 5 MG tablet TAKE 1 TABLET(5 MG) BY MOUTH DAILY  . atorvastatin (LIPITOR) 20 MG tablet Take 1 tablet (20 mg total) by mouth daily. Repeat labs are due now  . cetirizine (ZYRTEC) 10 MG tablet Take 10 mg by mouth daily.  . cholecalciferol (VITAMIN D) 1000 UNITS tablet Take 1,000 Units by mouth daily.  . cyclobenzaprine (FLEXERIL) 10 MG tablet Take 1 tablet (10 mg total) by mouth 3 (three) times daily as needed.  . furosemide (LASIX) 40 MG tablet Take 1 tablet (40 mg total) by mouth as needed.  Marland Kitchen glucose blood (ACCU-CHEK AVIVA PLUS) test strip Check blood sugar once daily  . ipratropium-albuterol (DUONEB) 0.5-2.5 (3) MG/3ML  SOLN Take 3 mLs by nebulization every 6 (six) hours as needed.  . metaxalone (SKELAXIN) 800 MG tablet   . metFORMIN (GLUCOPHAGE) 500 MG tablet TAKE 1 TABLET BY MOUTH TWICE DAILY WITH A MEAL  . methadone (DOLOPHINE) 5 MG tablet   . OXYGEN-HELIUM IN Inhale 2 L into the lungs at bedtime.  . pantoprazole (PROTONIX) 40 MG tablet Take 40 mg by mouth daily.  . Potassium Chloride ER 20 MEQ TBCR   . sertraline (ZOLOFT) 50 MG tablet TAKE 1 TABLET BY MOUTH DAILY  . spironolactone (ALDACTONE) 50 MG tablet TAKE 1 TABLET(50 MG) BY MOUTH DAILY  . [DISCONTINUED] albuterol (PROAIR HFA) 108 (90 BASE) MCG/ACT inhaler Inhale 2 puffs into the lungs every 6 (six) hours as needed for wheezing or shortness of  breath.  . [DISCONTINUED] ALPRAZolam (XANAX) 0.25 MG tablet TAKE 1 TABLET BY MOUTH THREE TIMES DAILY AS NEEDED FOR ANXIETY OR SLEEP  . [DISCONTINUED] amitriptyline (ELAVIL) 10 MG tablet 1-3 at bedtime as directed for nightmares   No facility-administered encounter medications on file as of 12/10/2016.     Activities of Daily Living In your present state of health, do you have any difficulty performing the following activities: 12/10/2016  Hearing? N  Vision? N  Difficulty concentrating or making decisions? N  Walking or climbing stairs? Y  Dressing or bathing? N  Doing errands, shopping? N  Preparing Food and eating ? N  Using the Toilet? N  In the past six months, have you accidently leaked urine? N  Do you have problems with loss of bowel control? N  Managing your Medications? N  Managing your Finances? N  Housekeeping or managing your Housekeeping? N  Some recent data might be hidden    Patient Care Team: Donato SchultzYvonne R Lowne Chase, DO as PCP - General (Family Medicine)    Assessment:    Physical assessment deferred to PCP.  Exercise Activities and Dietary recommendations Current Exercise Habits: The patient does not participate in regular exercise at present  Diet (meal preparation, eat out, water intake, caffeinated beverages, dairy products, fruits and vegetables): in general, an "unhealthy" diet, on average, 1-2 meals per day. Able to prepare meals. Meals vary-often picks up meals out. Admits to eating too much 'junk'  And frequent snacking lately. Drinks water, soda, and regular soda.       Goals    . Increase physical activity      Fall Risk Fall Risk  12/10/2016  Falls in the past year? Yes  Number falls in past yr: 2 or more  Injury with Fall? No  Risk for fall due to : Impaired balance/gait;History of fall(s)  Follow up Falls prevention discussed   Depression Screen PHQ 2/9 Scores 12/10/2016  PHQ - 2 Score 3  PHQ- 9 Score 14     Cognitive Function MMSE -  Mini Mental State Exam 12/10/2016  Orientation to time 5  Orientation to Place 5  Registration 3  Attention/ Calculation 5  Recall 3  Language- name 2 objects 2  Language- repeat 1  Language- follow 3 step command 3  Language- read & follow direction 1  Write a sentence 1  Copy design 1  Total score 30        Immunization History  Administered Date(s) Administered  . Influenza,inj,Quad PF,36+ Mos 11/17/2015  . Influenza-Unspecified 08/26/2014  . Pneumococcal Conjugate-13 11/17/2015  . Pneumococcal Polysaccharide-23 11/15/2014  . Tdap 05/18/2015  . Zoster 02/04/2014   Screening Tests Health Maintenance  Topic Date  Due  . MAMMOGRAM  10/14/2015  . DEXA SCAN  12/06/2015  . URINE MICROALBUMIN  05/16/2016  . HEMOGLOBIN A1C  05/17/2016  . INFLUENZA VACCINE  06/26/2016  . FOOT EXAM  11/16/2016  . OPHTHALMOLOGY EXAM  05/26/2017  . PNA vac Low Risk Adult (2 of 2 - PPSV23) 11/16/2019  . COLONOSCOPY  02/04/2022  . TETANUS/TDAP  05/17/2025  . ZOSTAVAX  Completed  . Hepatitis C Screening  Completed      Plan:    During the course of the visit the patient was educated and counseled about the following appropriate screening and preventive services:   Vaccines to include Pneumoccal, Influenza, Hepatitis B, Td, Zostavax, HCV  Electrocardiogram  Cardiovascular Disease  Colorectal cancer screening  Bone density screening  Diabetes screening  Glaucoma screening  Mammography/PAP  Nutrition counseling   Patient Instructions (the written plan) was given to the patient.   Starla Link, RN  12/10/2016

## 2016-12-10 NOTE — Progress Notes (Signed)
HPI  female never smoker followed for sleep hypoxemia, OSA/failed CPAP, OHS nightmares, complicated by history facial swelling, morbid obesity NPSG 05/11/12-[facility]-  Mild obstructive sleep apnea, AHI 14.9 per hour with body weight 288 pounds.PLMI 5.3/ hr. CPAP titration 06/03/12 to 11. Allergy profile- 03/11/14- Total IgE 863.9, + dust, box elder ONOX 03/11/14 qualified for sleep O2 2L  ---------------------------------------------------------------  11/17/2015-66 year old female never smoker followed for OSA, nightmares, asthma, history facial swelling, complicated by morbid obesity Failed to tolerate CPAP FOLOWS FOR: Using 2 Liters O2 at night with sleep. No complaints. Pt wants flu vaccine.  12/10/2016-66 year old female never smoker followed for OSA/failed CPAP, OHS, nightmares, complicated by history facial swelling, morbid obesity O2 2 L sleep/[facility] FOLLOWS WUJ:[id number]:Wants Flu shot today. Pt denies any wheezing, cough, SOB or congestion. Pt contines to wear O2 QHS as well. Occasional mild chest tightness especially when anxious, only occasional use of rescue inhaler. Little cough or wheeze. If sleeps without oxygen she feels uncomfortable and wakes up "screaming". Sleeps much better with amitriptyline and occasional Xanax. No daytime oversedation. Main stress has been dealing with family health issues-sister in a facility with dementia, mother aged. Office Spirometry 12/10/2016-normal spirometry-FVC 1.80/79%, FEV1 1.44/82%, ratio 0.80  ROS-see HPI    + = pos Constitutional:    weight loss, night sweats, fevers, chills, fatigue, lassitude. HEENT:   + headaches, no-difficulty swallowing, tooth/dental problems, sore throat,       No-  sneezing, itching, ear ache, +nasal congestion, post nasal drip,  CV:  No-   chest pain, orthopnea, PND, swelling in lower extremities, anasarca,                                                       dizziness, palpitations Resp: +shortness of breath with  exertion or at rest.              No-   productive cough,  No non-productive cough,  No- coughing up of blood.              No-   change in color of mucus.  No- wheezing.   Skin: No-   rash or lesions. GI:  No-   heartburn, indigestion, abdominal pain, nausea, vomiting,  GU:  MS:  No-   joint pain or swelling.   Neuro-     nothing unusual Psych:  No- change in mood or affect. No depression + anxiety.  No memory loss.  OBJ- Physical Exam General- Alert, Oriented, Affect-appropriate, Distress- none acute,+very overweight Skin- rash-none, lesions- none, excoriation- none Lymphadenopathy- none Head- atraumatic            Eyes- Gross vision intact, PERRLA, conjunctivae and secretions clear,                    + periorbital  Fullness- edema or fat            Ears- Hearing, canals-normal            Nose- Clear, no-Septal dev, mucus, polyps, erosion, perforation             Throat- Mallampati II , mucosa clear , drainage- none, tonsils+ present Neck- flexible , trachea midline, no stridor , thyroid nl, carotid no bruit Chest - symmetrical excursion , unlabored           Heart/CV-  RRR , no murmur , no gallop  , no rub, nl s1 s2                           - JVD- none , edema- none, stasis changes- none, varices- none           Lung- clear to P&A, wheeze- none, cough- none , dullness-none, rub- none           Chest wall-  Abd-  Br/ Gen/ Rectal- Not done, not indicated Extrem- cyanosis- none, clubbing, none, atrophy- none, strength- nl. Neuro- grossly intact to observation

## 2016-12-10 NOTE — Patient Instructions (Addendum)
Carbohydrate Counting for Diabetes Mellitus, Adult Carbohydrate counting is a method for keeping track of how many carbohydrates you eat. Eating carbohydrates naturally increases the amount of sugar (glucose) in the blood. Counting how many carbohydrates you eat helps keep your blood glucose within normal limits, which helps you manage your diabetes (diabetes mellitus). It is important to know how many carbohydrates you can safely have in each meal. This is different for every person. A diet and nutrition specialist (registered dietitian) can help you make a meal plan and calculate how many carbohydrates you should have at each meal and snack. Carbohydrates are found in the following foods:  Grains, such as breads and cereals.  Dried beans and soy products.  Starchy vegetables, such as potatoes, peas, and corn.  Fruit and fruit juices.  Milk and yogurt.  Sweets and snack foods, such as cake, cookies, candy, chips, and soft drinks. How do I count carbohydrates? There are two ways to count carbohydrates in food. You can use either of the methods or a combination of both. Reading "Nutrition Facts" on packaged food  The "Nutrition Facts" list is included on the labels of almost all packaged foods and beverages in the [place] It includes:  The serving size.  Information about nutrients in each serving, including the grams (g) of carbohydrate per serving. To use the "Nutrition Facts":  Decide how many servings you will have.  Multiply the number of servings by the number of carbohydrates per serving.  The resulting number is the total amount of carbohydrates that you will be having. Learning standard serving sizes of other foods  When you eat foods containing carbohydrates that are not packaged or do not include "Nutrition Facts" on the label, you need to measure the servings in order to count the amount of carbohydrates:  Measure the foods that you will eat with a food scale or measuring  cup, if needed.  Decide how many standard-size servings you will eat.  Multiply the number of servings by 15. Most carbohydrate-rich foods have about 15 g of carbohydrates per serving.  For example, if you eat 8 oz (170 g) of strawberries, you will have eaten 2 servings and 30 g of carbohydrates (2 servings x 15 g = 30 g).  For foods that have more than one food mixed, such as soups and casseroles, you must count the carbohydrates in each food that is included. The following list contains standard serving sizes of common carbohydrate-rich foods. Each of these servings has about 15 g of carbohydrates:   hamburger bun or  English muffin.   oz (15 mL) syrup.   oz (14 g) jelly.  1 slice of bread.  1 six-inch tortilla.  3 oz (85 g) cooked rice or pasta.  4 oz (113 g) cooked dried beans.  4 oz (113 g) starchy vegetable, such as peas, corn, or potatoes.  4 oz (113 g) hot cereal.  4 oz (113 g) mashed potatoes or  of a large baked potato.  4 oz (113 g) canned or frozen fruit.  4 oz (120 mL) fruit juice.  4-6 crackers.  6 chicken nuggets.  6 oz (170 g) unsweetened dry cereal.  6 oz (170 g) plain fat-free yogurt or yogurt sweetened with artificial sweeteners.  8 oz (240 mL) milk.  8 oz (170 g) fresh fruit or one small piece of fruit.  24 oz (680 g) popped popcorn. Example of carbohydrate counting Sample meal  3 oz (85 g) chicken breast.  6 oz (  170 g) brown rice.  4 oz (113 g) corn.  8 oz (240 mL) milk.  8 oz (170 g) strawberries with sugar-free whipped topping. Carbohydrate calculation 1. Identify the foods that contain carbohydrates:  Rice.  Corn.  Milk.  Strawberries. 2. Calculate how many servings you have of each food:  2 servings rice.  1 serving corn.  1 serving milk.  1 serving strawberries. 3. Multiply each number of servings by 15 g:  2 servings rice x 15 g = 30 g.  1 serving corn x 15 g = 15 g.  1 serving milk x 15 g = 15  g.  1 serving strawberries x 15 g = 15 g. 4. Add together all of the amounts to find the total grams of carbohydrates eaten:  30 g + 15 g + 15 g + 15 g = 75 g of carbohydrates total. This information is not intended to replace advice given to you by your health care provider. Make sure you discuss any questions you have with your health care provider. Document Released: 11/12/2005 Document Revised: 06/01/2016 Document Reviewed: 04/25/2016 Elsevier Interactive Patient Education  2017 [employer]  

## 2016-12-10 NOTE — Progress Notes (Signed)
Patient ID: Amy Mcgee, female    DOB: 11-13-1951  Age: 66 y.o. MRN: [id number]    Subjective:  Subjective  HPI Amy Mcgee presents for f/u dm, cholesterol and htn.  No complaints. HYPERTENSION   Blood pressure range-normal per pt  Chest pain- no      Dyspnea- no Lightheadedness- no   Edema- no  Other side effects - no   Medication compliance: good Low salt diet- yes    DIABETES    Blood Sugar ranges-105-140  Polyuria- no New Visual problems- no  Hypoglycemic symptoms- no  Other side effects-no Medication compliance - good Last eye exam- 05/2016 Foot exam- today   HYPERLIPIDEMIA  Medication compliance- good RUQ pain- no  Muscle aches- no Other side effects-no   Review of Systems  Constitutional: Negative for activity change, appetite change, fatigue and unexpected weight change.  Respiratory: Negative for cough and shortness of breath.   Cardiovascular: Negative for chest pain and palpitations.  Psychiatric/Behavioral: Positive for decreased concentration. Negative for behavioral problems and dysphoric mood. The patient is nervous/anxious.     History Past Medical History:  Diagnosis Date  . Diabetes mellitus without complication (HCC)   . Hypercholesterolemia   . Hypertension   . Sinusitis     She has a past surgical history that includes Spine surgery (2002); Joint replacement; Abdominal hysterectomy (1999); and Carpal tunnel release (Left, 04/2016).   Her family history includes Allergies in her brother, brother, daughter, maternal aunt, and mother; Stroke in her mother.She reports that she has never smoked. She has never used smokeless tobacco. She reports that she does not drink alcohol or use drugs.  Current Outpatient Prescriptions on File Prior to Visit  Medication Sig Dispense Refill  . ACCU-CHEK SOFTCLIX LANCETS lancets TEST BLOOD SUGAR ONCE DAILY 100 each 3  . albuterol (PROAIR HFA) 108 (90 Base) MCG/ACT inhaler Inhale 2 puffs into the lungs every 6  (six) hours as needed for wheezing or shortness of breath. 1 Inhaler 11  . ALPRAZolam (XANAX) 0.25 MG tablet TAKE 1 TABLET BY MOUTH THREE TIMES DAILY AS NEEDED FOR ANXIETY OR SLEEP 30 tablet 5  . amitriptyline (ELAVIL) 10 MG tablet 1-3 at bedtime as directed for nightmares 31 tablet 12  . cetirizine (ZYRTEC) 10 MG tablet Take 10 mg by mouth daily.    . cholecalciferol (VITAMIN D) 1000 UNITS tablet Take 1,000 Units by mouth daily.    . cyclobenzaprine (FLEXERIL) 10 MG tablet Take 1 tablet (10 mg total) by mouth 3 (three) times daily as needed. 30 tablet 2  . glucose blood (ACCU-CHEK AVIVA PLUS) test strip Check blood sugar once daily 100 each 3  . ipratropium-albuterol (DUONEB) 0.5-2.5 (3) MG/3ML SOLN Take 3 mLs by nebulization every 6 (six) hours as needed.    . metaxalone (SKELAXIN) 800 MG tablet   2  . methadone (DOLOPHINE) 5 MG tablet   0  . OXYGEN-HELIUM IN Inhale 2 L into the lungs at bedtime.    . sertraline (ZOLOFT) 50 MG tablet TAKE 1 TABLET BY MOUTH DAILY 90 tablet 0   No current facility-administered medications on file prior to visit.      Objective:  Objective  Physical Exam  Constitutional: She is oriented to person, place, and time. She appears well-developed and well-nourished.  HENT:  Head: Normocephalic and atraumatic.  Eyes: Conjunctivae and EOM are normal.  Neck: Normal range of motion. Neck supple. No JVD present. Carotid bruit is not present. No thyromegaly present.  Cardiovascular: Normal rate, regular  rhythm and normal heart sounds.   No murmur heard. Pulmonary/Chest: Effort normal and breath sounds normal. No respiratory distress. She has no wheezes. She has no rales. She exhibits no tenderness.  Musculoskeletal: She exhibits no edema.  Neurological: She is alert and oriented to person, place, and time.  Psychiatric: She has a normal mood and affect. Her behavior is normal. Judgment and thought content normal.  Nursing note and vitals reviewed. Sensory exam of  the foot is normal, tested with the monofilament. Good pulses, no lesions or ulcers, good peripheral pulses.  BP 130/76 (BP Location: Right Arm, Patient Position: Sitting, Cuff Size: Large)   Pulse (!) 59   Temp 97.9 F (36.6 C) (Oral)   Resp 18   Ht 5\' 3"  (1.6 m)   Wt 273 lb 9.6 oz (124.1 kg)   SpO2 99%   BMI 48.47 kg/m  Wt Readings from Last 3 Encounters:  12/10/16 273 lb 9.6 oz (124.1 kg)  12/10/16 272 lb 9.6 oz (123.7 kg)  11/17/15 256 lb 6.4 oz (116.3 kg)     Lab Results  Component Value Date   WBC 5.0 12/10/2016   HGB 12.9 12/10/2016   HCT 38.6 12/10/2016   PLT 261.0 12/10/2016   GLUCOSE 91 12/10/2016   CHOL 196 12/11/2016   TRIG 59.0 12/11/2016   HDL 68.60 12/11/2016   LDLCALC 115 (H) 12/11/2016   ALT 20 12/10/2016   AST 22 12/10/2016   NA 140 12/10/2016   K 4.5 12/10/2016   CL 105 12/10/2016   CREATININE 0.77 12/10/2016   BUN 15 12/10/2016   CO2 30 12/10/2016   TSH 2.51 02/04/2014   HGBA1C 6.4 11/17/2015   MICROALBUR 0.7 05/17/2015    Dg Chest 2 View  Result Date: 12/10/2016 CLINICAL DATA:  Shortness of breath.  Hypertension. EXAM: CHEST  2 VIEW COMPARISON:  May 18, 2014 FINDINGS: There is no edema or consolidation. Heart size and pulmonary vascularity are normal. No adenopathy. No bone lesions. IMPRESSION: No edema or consolidation. Electronically Signed   By: Bretta Bang III M.D.   On: 12/10/2016 14:24     Assessment & Plan:  Plan  I have changed Amy Mcgee's atorvastatin and Potassium Chloride ER. I am also having her start on sertraline. Additionally, I am having her maintain her ipratropium-albuterol, cholecalciferol, cetirizine, OXYGEN-HELIUM IN, metaxalone, methadone, glucose blood, ACCU-CHEK SOFTCLIX LANCETS, cyclobenzaprine, sertraline, albuterol, ALPRAZolam, amitriptyline, pantoprazole, amLODipine, metFORMIN, spironolactone, and furosemide.  Meds ordered this encounter  Medications  . pantoprazole (PROTONIX) 40 MG tablet    Sig: Take 40  mg by mouth daily.  Marland Kitchen amLODipine (NORVASC) 5 MG tablet    Sig: TAKE 1 TABLET(5 MG) BY MOUTH DAILY    Dispense:  90 tablet    Refill:  1  . atorvastatin (LIPITOR) 20 MG tablet    Sig: Take 1 tablet (20 mg total) by mouth daily.    Dispense:  90 tablet    Refill:  1  . metFORMIN (GLUCOPHAGE) 500 MG tablet    Sig: TAKE 1 TABLET BY MOUTH TWICE DAILY WITH A MEAL    Dispense:  180 tablet    Refill:  1  . spironolactone (ALDACTONE) 50 MG tablet    Sig: TAKE 1 TABLET(50 MG) BY MOUTH DAILY    Dispense:  90 tablet    Refill:  1  . furosemide (LASIX) 40 MG tablet    Sig: Take 1 tablet (40 mg total) by mouth as needed.    Dispense:  90 tablet  Refill:  1  . sertraline (ZOLOFT) 100 MG tablet    Sig: Take 1 tablet (100 mg total) by mouth daily.    Dispense:  90 tablet    Refill:  3  . Potassium Chloride ER 20 MEQ TBCR    Sig: 1 po qd    Dispense:  90 tablet    Refill:  3    Problem List Items Addressed This Visit      Unprioritized   HLD (hyperlipidemia)   Relevant Medications   amLODipine (NORVASC) 5 MG tablet   atorvastatin (LIPITOR) 20 MG tablet   spironolactone (ALDACTONE) 50 MG tablet   furosemide (LASIX) 40 MG tablet   DM (diabetes mellitus) type II controlled peripheral vascular disorder (HCC)   Relevant Medications   amLODipine (NORVASC) 5 MG tablet   atorvastatin (LIPITOR) 20 MG tablet   metFORMIN (GLUCOPHAGE) 500 MG tablet   spironolactone (ALDACTONE) 50 MG tablet   furosemide (LASIX) 40 MG tablet   HTN (hypertension)   Relevant Medications   amLODipine (NORVASC) 5 MG tablet   atorvastatin (LIPITOR) 20 MG tablet   spironolactone (ALDACTONE) 50 MG tablet   furosemide (LASIX) 40 MG tablet   Encounter for Medicare annual wellness exam - Primary   Preventative health care    ghm utd Check labs See aVS       Other Visit Diagnoses    Screening for malignant neoplasm of breast       Relevant Orders   MM Digital Screening   Depression with anxiety        Relevant Medications   sertraline (ZOLOFT) 100 MG tablet   Hypokalemia       Relevant Medications   Potassium Chloride ER 20 MEQ TBCR      Follow-up: Return in about 6 months (around 06/09/2017) for hypertension, hyperlipidemia, diabetes II.  Amy SchultzYvonne R Lowne Chase, DO

## 2016-12-10 NOTE — Progress Notes (Signed)
Pre visit review using our clinic review tool, if applicable. No additional management support is needed unless otherwise documented below in the visit note. 

## 2016-12-11 ENCOUNTER — Other Ambulatory Visit (INDEPENDENT_AMBULATORY_CARE_PROVIDER_SITE_OTHER): Payer: Medicare Other

## 2016-12-11 DIAGNOSIS — E785 Hyperlipidemia, unspecified: Secondary | ICD-10-CM | POA: Diagnosis not present

## 2016-12-11 LAB — LIPID PANEL
CHOL/HDL RATIO: 3
Cholesterol: 196 mg/dL (ref 0–200)
HDL: 68.6 mg/dL (ref >39.00)
LDL CALC: 115 mg/dL — AB (ref 0–99)
NonHDL: 126.92
TRIGLYCERIDES: 59 mg/dL (ref 0.0–149.0)
VLDL: 11.8 mg/dL (ref 0.0–40.0)

## 2016-12-12 DIAGNOSIS — Z0000 Encounter for general adult medical examination without abnormal findings: Secondary | ICD-10-CM | POA: Insufficient documentation

## 2016-12-12 NOTE — Assessment & Plan Note (Signed)
Stable con't meds 

## 2016-12-12 NOTE — Assessment & Plan Note (Signed)
Check labs con't meds 

## 2016-12-12 NOTE — Assessment & Plan Note (Signed)
ghm utd ?Check labs  ?See aVS ? ?

## 2016-12-13 DIAGNOSIS — E662 Morbid (severe) obesity with alveolar hypoventilation: Secondary | ICD-10-CM | POA: Insufficient documentation

## 2016-12-13 NOTE — Assessment & Plan Note (Signed)
She felt claustrophobic with CPAP and has not been interested in further management of this problem. Education done.

## 2016-12-13 NOTE — Assessment & Plan Note (Addendum)
I think there is a significant component of anxiety and stress related to burden she feels dealing with family health problems. Since she says these nightmares don't happen if she wears oxygen, implication is that she desaturates at least at times. Plan-continue oxygen. I agreed to refill her Xanax and amitriptyline with discussion.I would prefer  that these be managed by her primary physician

## 2016-12-13 NOTE — Assessment & Plan Note (Signed)
Morbidly obese. Subjective improvement with oxygen at night probably due to this. Her daughter, with her today, is even bigger Plan-consider for bariatric referral. Weight loss counseling, continue sleep O2

## 2016-12-17 ENCOUNTER — Telehealth: Payer: Self-pay | Admitting: Internal Medicine

## 2016-12-17 NOTE — Telephone Encounter (Signed)
Notes Recorded by Waymon Budgelinton D Young, MD on 12/10/2016 at 5:18 PM EST Labs- one liver test is elevated. Your primary doctor can decide what to do about this. Otherwise everything is normal  Notes Recorded by Waymon Budgelinton D Young, MD on 12/10/2016 at 5:23 PM EST CXR- stable, clear  lmtcb X1

## 2016-12-19 NOTE — Telephone Encounter (Signed)
lmomtcb x 2  

## 2016-12-20 NOTE — Telephone Encounter (Signed)
Called and spoke with pt and she is aware of results per CY.  Nothing further is needed.

## 2017-01-14 ENCOUNTER — Ambulatory Visit: Payer: Medicare Other | Admitting: Family Medicine

## 2017-02-06 ENCOUNTER — Telehealth: Payer: Self-pay | Admitting: Family Medicine

## 2017-02-06 NOTE — Telephone Encounter (Signed)
Pt's insurance called in to inquire about the pt's A1C. Showing pt's last reading was on 11/17/15. He says that pt is a diabetic and should have it checked more frequent. Pt's last cpe was in January.    He would like to make provider aware of insurance concern.

## 2017-02-07 NOTE — Telephone Encounter (Signed)
Pt needs a1c---- she was due for repeat labs / ov in March

## 2017-02-07 NOTE — Telephone Encounter (Signed)
Patient was seen by PCP on 12/10/2016 for AWV and did lipids only. She did at that time schedule 6 month followup for 06/17/2017 with PCP, but while on the phone canceled as unable to make that appoointment. Was informed of PCP instructions regarding appts/labs---she stated her daughter will call back to reschedule that July appt. As she brings her mom to her appts.

## 2017-02-13 ENCOUNTER — Telehealth: Payer: Self-pay | Admitting: Family Medicine

## 2017-02-13 NOTE — Telephone Encounter (Signed)
Did someone call her back?

## 2017-02-13 NOTE — Telephone Encounter (Signed)
Caller name:Oaklee Knoch Relationship to patient: Can be reached:[phone] Pharmacy:  Reason for call:Returning call, our call was disconnected.

## 2017-02-14 NOTE — Telephone Encounter (Signed)
Yes she is returning a call, request call back

## 2017-02-15 NOTE — Telephone Encounter (Signed)
Patient contacted and had already had her appt changed so all is done and patient happy.

## 2017-04-05 LAB — HM MAMMOGRAPHY

## 2017-04-24 ENCOUNTER — Encounter: Payer: Self-pay | Admitting: *Deleted

## 2017-05-20 ENCOUNTER — Encounter: Payer: Self-pay | Admitting: Family Medicine

## 2017-05-20 ENCOUNTER — Ambulatory Visit (INDEPENDENT_AMBULATORY_CARE_PROVIDER_SITE_OTHER): Payer: Medicare Other | Admitting: Family Medicine

## 2017-05-20 VITALS — BP 118/70 | HR 98 | Temp 97.8°F | Resp 16 | Ht 62.0 in | Wt 273.0 lb

## 2017-05-20 DIAGNOSIS — E785 Hyperlipidemia, unspecified: Secondary | ICD-10-CM

## 2017-05-20 DIAGNOSIS — E1165 Type 2 diabetes mellitus with hyperglycemia: Secondary | ICD-10-CM

## 2017-05-20 DIAGNOSIS — M79672 Pain in left foot: Secondary | ICD-10-CM | POA: Diagnosis not present

## 2017-05-20 DIAGNOSIS — F418 Other specified anxiety disorders: Secondary | ICD-10-CM | POA: Diagnosis not present

## 2017-05-20 DIAGNOSIS — I10 Essential (primary) hypertension: Secondary | ICD-10-CM | POA: Diagnosis not present

## 2017-05-20 DIAGNOSIS — E1151 Type 2 diabetes mellitus with diabetic peripheral angiopathy without gangrene: Secondary | ICD-10-CM

## 2017-05-20 DIAGNOSIS — IMO0002 Reserved for concepts with insufficient information to code with codable children: Secondary | ICD-10-CM

## 2017-05-20 LAB — COMPREHENSIVE METABOLIC PANEL
ALBUMIN: 3.9 g/dL (ref 3.5–5.2)
ALT: 15 U/L (ref 0–35)
AST: 21 U/L (ref 0–37)
Alkaline Phosphatase: 130 U/L — ABNORMAL HIGH (ref 39–117)
BILIRUBIN TOTAL: 0.6 mg/dL (ref 0.2–1.2)
BUN: 16 mg/dL (ref 6–23)
CALCIUM: 9.6 mg/dL (ref 8.4–10.5)
CHLORIDE: 103 meq/L (ref 96–112)
CO2: 27 mEq/L (ref 19–32)
CREATININE: 0.87 mg/dL (ref 0.40–1.20)
GFR: 83.66 mL/min (ref >60.00)
Glucose, Bld: 94 mg/dL (ref 70–99)
Potassium: 3.8 mEq/L (ref 3.5–5.1)
SODIUM: 137 meq/L (ref 135–145)
TOTAL PROTEIN: 7.2 g/dL (ref 6.0–8.3)

## 2017-05-20 LAB — HEMOGLOBIN A1C: Hgb A1c MFr Bld: 6.7 % — ABNORMAL HIGH (ref 4.6–6.5)

## 2017-05-20 LAB — LIPID PANEL
CHOL/HDL RATIO: 3
CHOLESTEROL: 160 mg/dL (ref 0–200)
HDL: 55.8 mg/dL (ref >39.00)
LDL CALC: 84 mg/dL (ref 0–99)
NonHDL: 104.34
TRIGLYCERIDES: 101 mg/dL (ref 0.0–149.0)
VLDL: 20.2 mg/dL (ref 0.0–40.0)

## 2017-05-20 MED ORDER — SERTRALINE HCL 100 MG PO TABS: 100.0000 mg | ORAL_TABLET | Freq: Every day | ORAL | 3 refills | Status: DC

## 2017-05-20 NOTE — Patient Instructions (Signed)

## 2017-05-20 NOTE — Progress Notes (Signed)
Patient ID: Calista Crain, female   DOB: [birth year], 66 y.o.   MRN: [id number]    Subjective:  I acted as a Neurosurgeon for Dr. Zola Button.  Apolonio Schneiders, CMA   Patient ID: Devi Hopman, female    DOB: 1951/02/23, 66 y.o.   MRN: [id number]  Chief Complaint  Patient presents with  . Hypertension  . Hyperlipidemia  . Diabetes    HPI  Patient is in today for follow up blood pressure, cholesterol, and diabetes.  Checks sugars at home sometimes.  Has been doing well on current treatment with no side effects.  Drugs of the 'triptan' class are very effective for migraine treatment but can have significant side effects which are explained carefully to her. I have reviewed the contraindication with ischemic heart disease with the patient, and the patient and I agree that her risk of ischemic disease is very low and it is appropriate to use a triptan medication. If such risk factors change in the future she will stop using the drug and let me know. Side effects such as transient chest or neck pain may occur, nausea, diarrhea, tingling, flushing are common. Re-dosing, if required, should be done once after a 2-hour interval.   Patient Care Team: Zola Button, Grayling Congress, DO as PCP - General (Family Medicine) Waymon Budge, MD as Consulting Physician (Pulmonary Disease)   Past Medical History:  Diagnosis Date  . Diabetes mellitus without complication (HCC)   . Hypercholesterolemia   . Hypertension   . Sinusitis     Past Surgical History:  Procedure Laterality Date  . ABDOMINAL HYSTERECTOMY  1999  . CARPAL TUNNEL RELEASE Left 04/2016  . JOINT REPLACEMENT     right 2008 and left 2003  knee  . SPINE SURGERY  2002   cervical    Family History  Problem Relation Age of Onset  . Allergies Mother   . Stroke Mother   . Allergies Maternal Aunt   . Allergies Brother   . Allergies Brother   . Allergies Daughter     Social History   Social History  . Marital status: Divorced    Spouse name: N/A    . Number of children: N/A  . Years of education: N/A   Occupational History  . retired    Social History Main Topics  . Smoking status: Never Smoker  . Smokeless tobacco: Never Used  . Alcohol use No  . Drug use: No  . Sexual activity: No   Other Topics Concern  . Not on file   Social History Narrative   Lives in [place] but comes to [place] to live with her daughter for several weeks at a time    Outpatient Medications Prior to Visit  Medication Sig Dispense Refill  . ACCU-CHEK SOFTCLIX LANCETS lancets TEST BLOOD SUGAR ONCE DAILY 100 each 3  . albuterol (PROAIR HFA) 108 (90 Base) MCG/ACT inhaler Inhale 2 puffs into the lungs every 6 (six) hours as needed for wheezing or shortness of breath. 1 Inhaler 11  . ALPRAZolam (XANAX) 0.25 MG tablet TAKE 1 TABLET BY MOUTH THREE TIMES DAILY AS NEEDED FOR ANXIETY OR SLEEP 30 tablet 5  . amitriptyline (ELAVIL) 10 MG tablet 1-3 at bedtime as directed for nightmares 31 tablet 12  . amLODipine (NORVASC) 5 MG tablet TAKE 1 TABLET(5 MG) BY MOUTH DAILY 90 tablet 1  . atorvastatin (LIPITOR) 20 MG tablet Take 1 tablet (20 mg total) by mouth daily. 90 tablet 1  . cetirizine (ZYRTEC) 10 MG tablet  Take 10 mg by mouth daily.    . cholecalciferol (VITAMIN D) 1000 UNITS tablet Take 1,000 Units by mouth daily.    . furosemide (LASIX) 40 MG tablet Take 1 tablet (40 mg total) by mouth as needed. 90 tablet 1  . glucose blood (ACCU-CHEK AVIVA PLUS) test strip Check blood sugar once daily 100 each 3  . ipratropium-albuterol (DUONEB) 0.5-2.5 (3) MG/3ML SOLN Take 3 mLs by nebulization every 6 (six) hours as needed.    . metaxalone (SKELAXIN) 800 MG tablet   2  . metFORMIN (GLUCOPHAGE) 500 MG tablet TAKE 1 TABLET BY MOUTH TWICE DAILY WITH A MEAL 180 tablet 1  . OXYGEN-HELIUM IN Inhale 2 L into the lungs at bedtime.    . pantoprazole (PROTONIX) 40 MG tablet Take 40 mg by mouth daily.    . Potassium Chloride ER 20 MEQ TBCR 1 po qd 90 tablet 3  . spironolactone  (ALDACTONE) 50 MG tablet TAKE 1 TABLET(50 MG) BY MOUTH DAILY 90 tablet 1  . cyclobenzaprine (FLEXERIL) 10 MG tablet Take 1 tablet (10 mg total) by mouth 3 (three) times daily as needed. 30 tablet 2  . methadone (DOLOPHINE) 5 MG tablet   0  . sertraline (ZOLOFT) 100 MG tablet Take 1 tablet (100 mg total) by mouth daily. 90 tablet 3  . sertraline (ZOLOFT) 50 MG tablet TAKE 1 TABLET BY MOUTH DAILY 90 tablet 0   No facility-administered medications prior to visit.     Allergies  Allergen Reactions  . Percocet [Oxycodone-Acetaminophen] Other (See Comments)    hallucinations  . Aleve [Naproxen Sodium] Swelling  . Lyrica [Pregabalin]     Suicidal     Review of Systems  Constitutional: Negative for fever and malaise/fatigue.  HENT: Negative for congestion.   Eyes: Negative for blurred vision.  Respiratory: Negative for cough and shortness of breath.   Cardiovascular: Negative for chest pain, palpitations and leg swelling.  Gastrointestinal: Negative for vomiting.  Musculoskeletal: Negative for back pain.  Skin: Negative for rash.  Neurological: Negative for loss of consciousness and headaches.       Objective:    Physical Exam  Constitutional: She is oriented to person, place, and time. She appears well-developed and well-nourished.  HENT:  Head: Normocephalic and atraumatic.  Eyes: Conjunctivae and EOM are normal.  Neck: Normal range of motion. Neck supple. No JVD present. Carotid bruit is not present. No thyromegaly present.  Cardiovascular: Normal rate, regular rhythm and normal heart sounds.   No murmur heard. Pulmonary/Chest: Effort normal and breath sounds normal. No respiratory distress. She has no wheezes. She has no rales. She exhibits no tenderness.  Musculoskeletal: She exhibits no edema.  Neurological: She is alert and oriented to person, place, and time.  Psychiatric: She has a normal mood and affect. Her behavior is normal. Thought content normal.  Nursing note and  vitals reviewed. Sensory exam of the foot is normal, tested with the monofilament. Good pulses, no lesions or ulcers, good peripheral pulses. BP 118/70 (BP Location: Left Arm, Cuff Size: Large)   Pulse 98   Temp 97.8 F (36.6 C) (Oral)   Resp 16   Ht 5\' 2"  (1.575 m)   Wt 273 lb (123.8 kg)   SpO2 98%   BMI 49.93 kg/m  Wt Readings from Last 3 Encounters:  05/21/17 272 lb (123.4 kg)  05/20/17 273 lb (123.8 kg)  12/10/16 273 lb 9.6 oz (124.1 kg)   BP Readings from Last 3 Encounters:  05/21/17 114/78  05/20/17 118/70  12/10/16 130/76     Immunization History  Administered Date(s) Administered  . Influenza,inj,Quad PF,36+ Mos 11/17/2015  . Influenza-Unspecified 08/26/2014  . Pneumococcal Conjugate-13 11/17/2015  . Pneumococcal Polysaccharide-23 11/15/2014  . Tdap 05/18/2015  . Zoster 02/04/2014    Health Maintenance  Topic Date Due  . DEXA SCAN  12/06/2015  . OPHTHALMOLOGY EXAM  05/26/2017  . INFLUENZA VACCINE  06/26/2017  . HEMOGLOBIN A1C  11/19/2017  . FOOT EXAM  12/10/2017  . MAMMOGRAM  04/05/2018  . URINE MICROALBUMIN  05/20/2018  . PNA vac Low Risk Adult (2 of 2 - PPSV23) 11/16/2019  . COLONOSCOPY  02/04/2022  . TETANUS/TDAP  05/17/2025  . Hepatitis C Screening  Completed    Lab Results  Component Value Date   WBC 5.0 12/10/2016   HGB 12.9 12/10/2016   HCT 38.6 12/10/2016   PLT 261.0 12/10/2016   GLUCOSE 94 05/20/2017   CHOL 160 05/20/2017   TRIG 101.0 05/20/2017   HDL 55.80 05/20/2017   LDLCALC 84 05/20/2017   ALT 15 05/20/2017   AST 21 05/20/2017   NA 137 05/20/2017   K 3.8 05/20/2017   CL 103 05/20/2017   CREATININE 0.87 05/20/2017   BUN 16 05/20/2017   CO2 27 05/20/2017   TSH 2.51 02/04/2014   HGBA1C 6.7 (H) 05/20/2017   MICROALBUR 1.3 05/20/2017    Lab Results  Component Value Date   TSH 2.51 02/04/2014   Lab Results  Component Value Date   WBC 5.0 12/10/2016   HGB 12.9 12/10/2016   HCT 38.6 12/10/2016   MCV 84.3 12/10/2016   PLT  261.0 12/10/2016   Lab Results  Component Value Date   NA 137 05/20/2017   K 3.8 05/20/2017   CO2 27 05/20/2017   GLUCOSE 94 05/20/2017   BUN 16 05/20/2017   CREATININE 0.87 05/20/2017   BILITOT 0.6 05/20/2017   ALKPHOS 130 (H) 05/20/2017   AST 21 05/20/2017   ALT 15 05/20/2017   PROT 7.2 05/20/2017   ALBUMIN 3.9 05/20/2017   CALCIUM 9.6 05/20/2017   GFR 83.66 05/20/2017   Lab Results  Component Value Date   CHOL 160 05/20/2017   Lab Results  Component Value Date   HDL 55.80 05/20/2017   Lab Results  Component Value Date   LDLCALC 84 05/20/2017   Lab Results  Component Value Date   TRIG 101.0 05/20/2017   Lab Results  Component Value Date   CHOLHDL 3 05/20/2017   Lab Results  Component Value Date   HGBA1C 6.7 (H) 05/20/2017         Assessment & Plan:   Problem List Items Addressed This Visit      Unprioritized   DM (diabetes mellitus) type II uncontrolled, periph vascular disorder (HCC) - Primary    hgba1c to be checked minimize simple carbs. Increase exercise as tolerated. Continue current meds       Relevant Orders   Hemoglobin A1c (Completed)   Comprehensive metabolic panel (Completed)   Lipid panel (Completed)   Microalbumin / creatinine urine ratio (Completed)   Essential hypertension    Well controlled, no changes to meds. Encouraged heart healthy diet such as the DASH diet and exercise as tolerated.       Relevant Orders   Hemoglobin A1c (Completed)   Comprehensive metabolic panel (Completed)   Lipid panel (Completed)   Microalbumin / creatinine urine ratio (Completed)   Hyperlipidemia    Tolerating statin, encouraged heart healthy diet, avoid trans fats,  minimize simple carbs and saturated fats. Increase exercise as tolerated      Relevant Orders   Hemoglobin A1c (Completed)   Comprehensive metabolic panel (Completed)   Lipid panel (Completed)   Microalbumin / creatinine urine ratio (Completed)    Other Visit Diagnoses    Pain  of left heel       Relevant Orders   Ambulatory referral to Sports Medicine   Depression with anxiety       Relevant Medications   sertraline (ZOLOFT) 100 MG tablet      I have discontinued Ms. Wheatley's methadone and cyclobenzaprine. I am also having her maintain her ipratropium-albuterol, cholecalciferol, cetirizine, OXYGEN-HELIUM IN, metaxalone, glucose blood, ACCU-CHEK SOFTCLIX LANCETS, albuterol, ALPRAZolam, amitriptyline, pantoprazole, amLODipine, atorvastatin, metFORMIN, spironolactone, furosemide, Potassium Chloride ER, and sertraline.  Meds ordered this encounter  Medications  . sertraline (ZOLOFT) 100 MG tablet    Sig: Take 1 tablet (100 mg total) by mouth daily.    Dispense:  90 tablet    Refill:  3    CMA served as scribe during this visit. History, Physical and Plan performed by medical provider. Documentation and orders reviewed and attested to.  Donato SchultzYvonne R Lowne Chase, DO

## 2017-05-21 ENCOUNTER — Ambulatory Visit (INDEPENDENT_AMBULATORY_CARE_PROVIDER_SITE_OTHER): Payer: Medicare Other | Admitting: Family Medicine

## 2017-05-21 ENCOUNTER — Encounter: Payer: Self-pay | Admitting: Family Medicine

## 2017-05-21 DIAGNOSIS — M7662 Achilles tendinitis, left leg: Secondary | ICD-10-CM

## 2017-05-21 DIAGNOSIS — R252 Cramp and spasm: Secondary | ICD-10-CM

## 2017-05-21 LAB — MICROALBUMIN / CREATININE URINE RATIO
CREATININE, URINE: 221 mg/dL (ref 20–320)
MICROALB/CREAT RATIO: 6 ug/mg{creat} (ref <30)
Microalb, Ur: 1.3 mg/dL

## 2017-05-21 NOTE — Patient Instructions (Addendum)
You have Achilles Tendinopathy Theraband strengthening - work way up to 3 sets of 10 once a day. Ice bucket 10-15 minutes at end of day - can ice 3-4 times a day. Avoid uneven ground, hills as much as possible. Heel lifts in shoes or shoes with a natural heel lift when up and walking around. Inserts like spencos, superfeet, dr. Jari Sportsmanscholls active series may also help. Consider physical therapy, orthotics, nitro patches if not improving as expected. Follow up in 6 weeks.  For the cramping usually we discuss making sure you're not dehydrated. The swelling in your legs is typically addressed with compression stockings and/or a fluid pill but the fluid pill can increase the risk of cramps. Working out will help alleviate the cramping too - the stronger your muscles, the less likely they are to cramp.

## 2017-05-22 DIAGNOSIS — M7662 Achilles tendinitis, left leg: Secondary | ICD-10-CM | POA: Insufficient documentation

## 2017-05-22 DIAGNOSIS — R252 Cramp and spasm: Secondary | ICD-10-CM | POA: Insufficient documentation

## 2017-05-22 NOTE — Assessment & Plan Note (Signed)
hgba1c to be checked minimize simple carbs. Increase exercise as tolerated. Continue current meds 

## 2017-05-22 NOTE — Progress Notes (Signed)
PCP: Donato Schultz, DO  Subjective:   HPI: Patient is a 66 y.o. female here for left heel pain.  Patient reports for the past months she's had pain posterior aspect of left heel. No acute injury or trauma. Seems worse with sidewalks. History of plantar fasciitis - wore boot for this remotely but it worsened her back. Not doing anything for this. Pain level 3/10 and dull. No skin changes, numbness. Also concerned about swelling, cramping of legs, bruising easily. Takes hydrocodone, ibuprofen, BC powders as needed.  Past Medical History:  Diagnosis Date  . Diabetes mellitus without complication (HCC)   . Hypercholesterolemia   . Hypertension   . Sinusitis     Current Outpatient Prescriptions on File Prior to Visit  Medication Sig Dispense Refill  . ACCU-CHEK SOFTCLIX LANCETS lancets TEST BLOOD SUGAR ONCE DAILY 100 each 3  . albuterol (PROAIR HFA) 108 (90 Base) MCG/ACT inhaler Inhale 2 puffs into the lungs every 6 (six) hours as needed for wheezing or shortness of breath. 1 Inhaler 11  . ALPRAZolam (XANAX) 0.25 MG tablet TAKE 1 TABLET BY MOUTH THREE TIMES DAILY AS NEEDED FOR ANXIETY OR SLEEP 30 tablet 5  . amitriptyline (ELAVIL) 10 MG tablet 1-3 at bedtime as directed for nightmares 31 tablet 12  . amLODipine (NORVASC) 5 MG tablet TAKE 1 TABLET(5 MG) BY MOUTH DAILY 90 tablet 1  . atorvastatin (LIPITOR) 20 MG tablet Take 1 tablet (20 mg total) by mouth daily. 90 tablet 1  . cetirizine (ZYRTEC) 10 MG tablet Take 10 mg by mouth daily.    . cholecalciferol (VITAMIN D) 1000 UNITS tablet Take 1,000 Units by mouth daily.    . furosemide (LASIX) 40 MG tablet Take 1 tablet (40 mg total) by mouth as needed. 90 tablet 1  . glucose blood (ACCU-CHEK AVIVA PLUS) test strip Check blood sugar once daily 100 each 3  . ipratropium-albuterol (DUONEB) 0.5-2.5 (3) MG/3ML SOLN Take 3 mLs by nebulization every 6 (six) hours as needed.    . metaxalone (SKELAXIN) 800 MG tablet   2  . metFORMIN  (GLUCOPHAGE) 500 MG tablet TAKE 1 TABLET BY MOUTH TWICE DAILY WITH A MEAL 180 tablet 1  . OXYGEN-HELIUM IN Inhale 2 L into the lungs at bedtime.    . pantoprazole (PROTONIX) 40 MG tablet Take 40 mg by mouth daily.    . Potassium Chloride ER 20 MEQ TBCR 1 po qd 90 tablet 3  . sertraline (ZOLOFT) 100 MG tablet Take 1 tablet (100 mg total) by mouth daily. 90 tablet 3  . spironolactone (ALDACTONE) 50 MG tablet TAKE 1 TABLET(50 MG) BY MOUTH DAILY 90 tablet 1   No current facility-administered medications on file prior to visit.     Past Surgical History:  Procedure Laterality Date  . ABDOMINAL HYSTERECTOMY  1999  . CARPAL TUNNEL RELEASE Left 04/2016  . JOINT REPLACEMENT     right 2008 and left 2003  knee  . SPINE SURGERY  2002   cervical    Allergies  Allergen Reactions  . Percocet [Oxycodone-Acetaminophen] Other (See Comments)    hallucinations  . Aleve [Naproxen Sodium] Swelling  . Lyrica [Pregabalin]     Suicidal     Social History   Social History  . Marital status: Divorced    Spouse name: N/A  . Number of children: N/A  . Years of education: N/A   Occupational History  . retired    Social History Main Topics  . Smoking status: Never Smoker  .  Smokeless tobacco: Never Used  . Alcohol use No  . Drug use: No  . Sexual activity: No   Other Topics Concern  . Not on file   Social History Narrative   Lives in [place] but comes to [place] to live with her daughter for several weeks at a time    Family History  Problem Relation Age of Onset  . Allergies Mother   . Stroke Mother   . Allergies Maternal Aunt   . Allergies Brother   . Allergies Brother   . Allergies Daughter     BP 114/78   Pulse (!) 112   Ht 5\' 2"  (1.575 m)   Wt 272 lb (123.4 kg)   BMI 49.75 kg/m   Review of Systems: See HPI above.     Objective:  Physical Exam:  Gen: NAD, comfortable in exam room  Left foot/ankle: Diffuse soft tissue swelling throughout both lower extremities.  No  other deformity, bruising. FROM ankle without pain.  5/5 strength. TTP achilles tendon at insertion on calcaneus.  No other tenderness. Negative ant drawer and talar tilt.   Negative syndesmotic compression. Negative calcaneal squeeze. Thompsons test negative. NV intact distally.  Right foot/ankle: FROM without pain.  Assessment & Plan:  1. Left achilles tendinopathy - shown home exercises to do with theraband.  Icing, heel lifts provided.  Consider inserts, physical therapy, custom orthotics, nitro patches.  F/u in 6 weeks.  2. Muscle cramps - reviewed recent bloodwork and no evidence abnormalities to account for cramping.  She is taking lasix but also supplementing with potassium.  Encouraged regular exercise with light cardio most days of the week and 1-2 days a week of strengthening on machines but only 1 set of 10 of each to start with, stressed importance of rest as well.  States was told not to use compression stockings in past.

## 2017-05-22 NOTE — Assessment & Plan Note (Signed)
reviewed recent bloodwork and no evidence abnormalities to account for cramping.  She is taking lasix but also supplementing with potassium.  Encouraged regular exercise with light cardio most days of the week and 1-2 days a week of strengthening on machines but only 1 set of 10 of each to start with, stressed importance of rest as well.  States was told not to use compression stockings in past.

## 2017-05-22 NOTE — Assessment & Plan Note (Signed)
shown home exercises to do with theraband.  Icing, heel lifts provided.  Consider inserts, physical therapy, custom orthotics, nitro patches.  F/u in 6 weeks.

## 2017-05-22 NOTE — Assessment & Plan Note (Signed)
Tolerating statin, encouraged heart healthy diet, avoid trans fats, minimize simple carbs and saturated fats. Increase exercise as tolerated 

## 2017-05-22 NOTE — Assessment & Plan Note (Signed)
Well controlled, no changes to meds. Encouraged heart healthy diet such as the DASH diet and exercise as tolerated.  °

## 2017-05-23 ENCOUNTER — Ambulatory Visit: Payer: Self-pay | Admitting: Family Medicine

## 2017-05-29 IMAGING — DX DG CHEST 2V
2 series · 2 of 2 positions shown · non-contrast
Comparison: May 18, 2014

CLINICAL DATA: Shortness of breath.  Hypertension.

EXAM:
CHEST  2 VIEW

[chest pa]
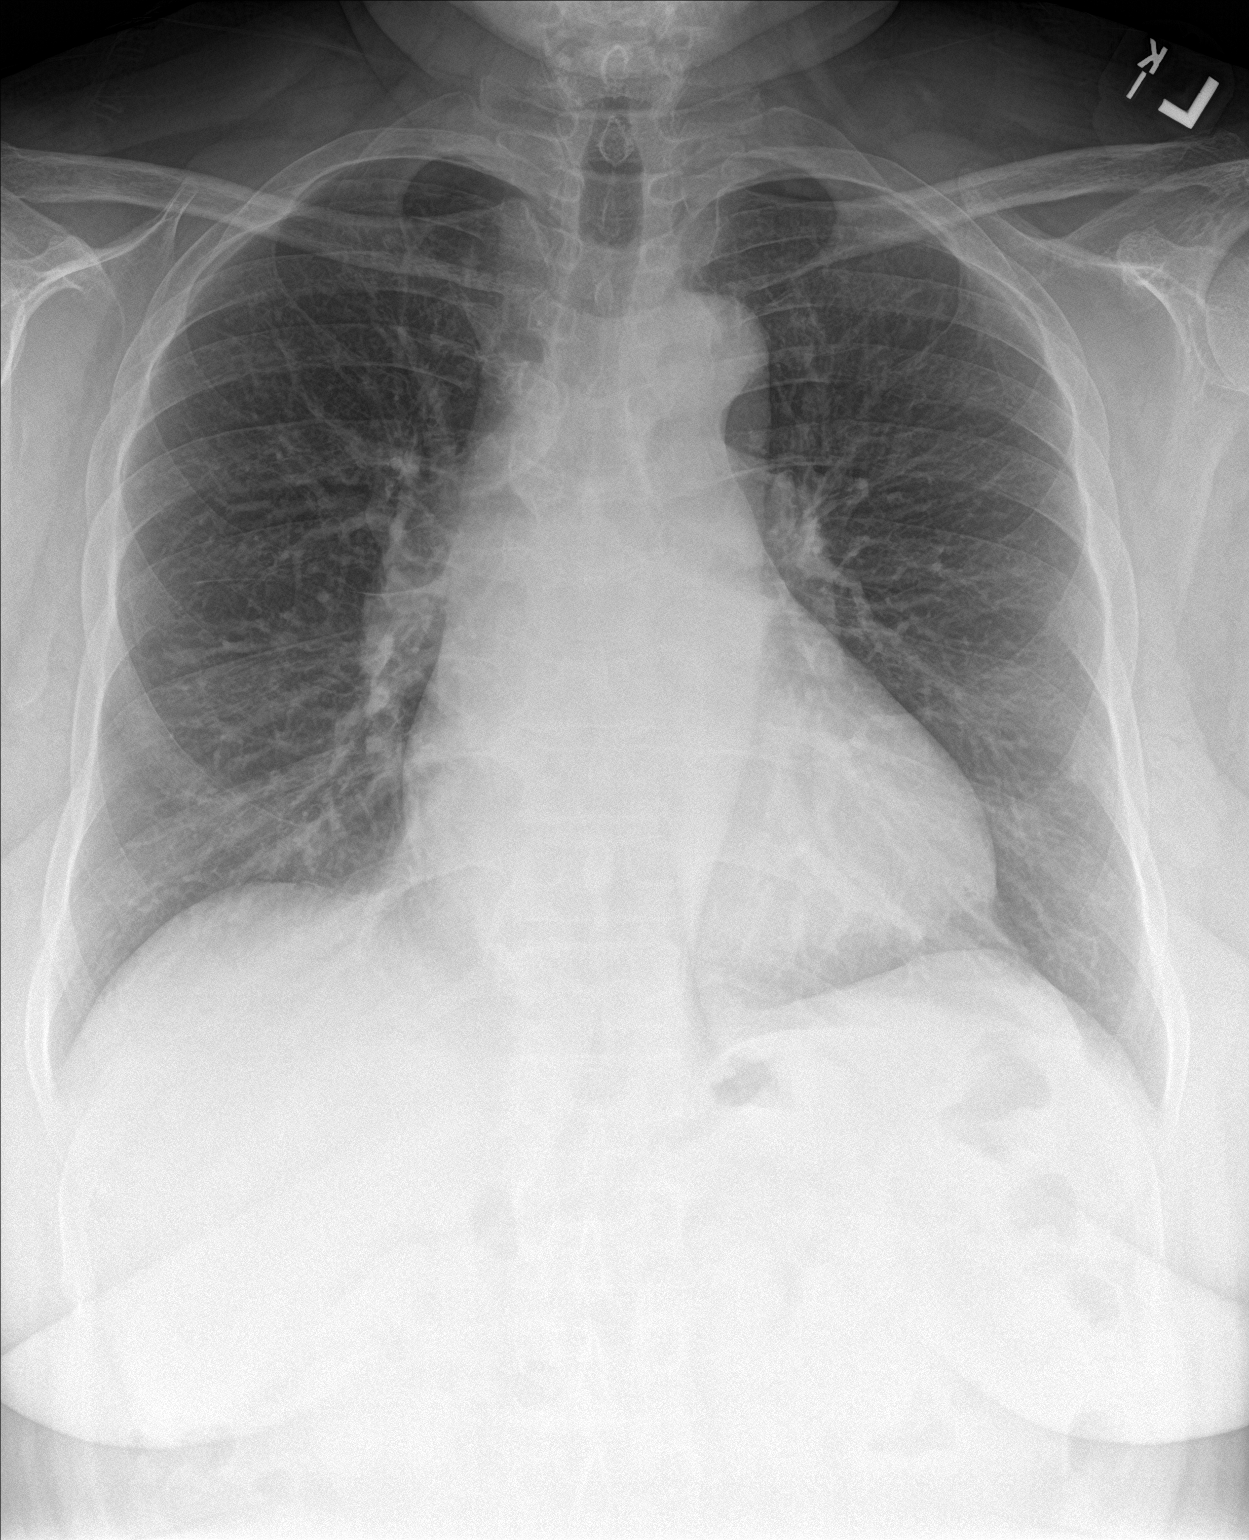

[chest lat]
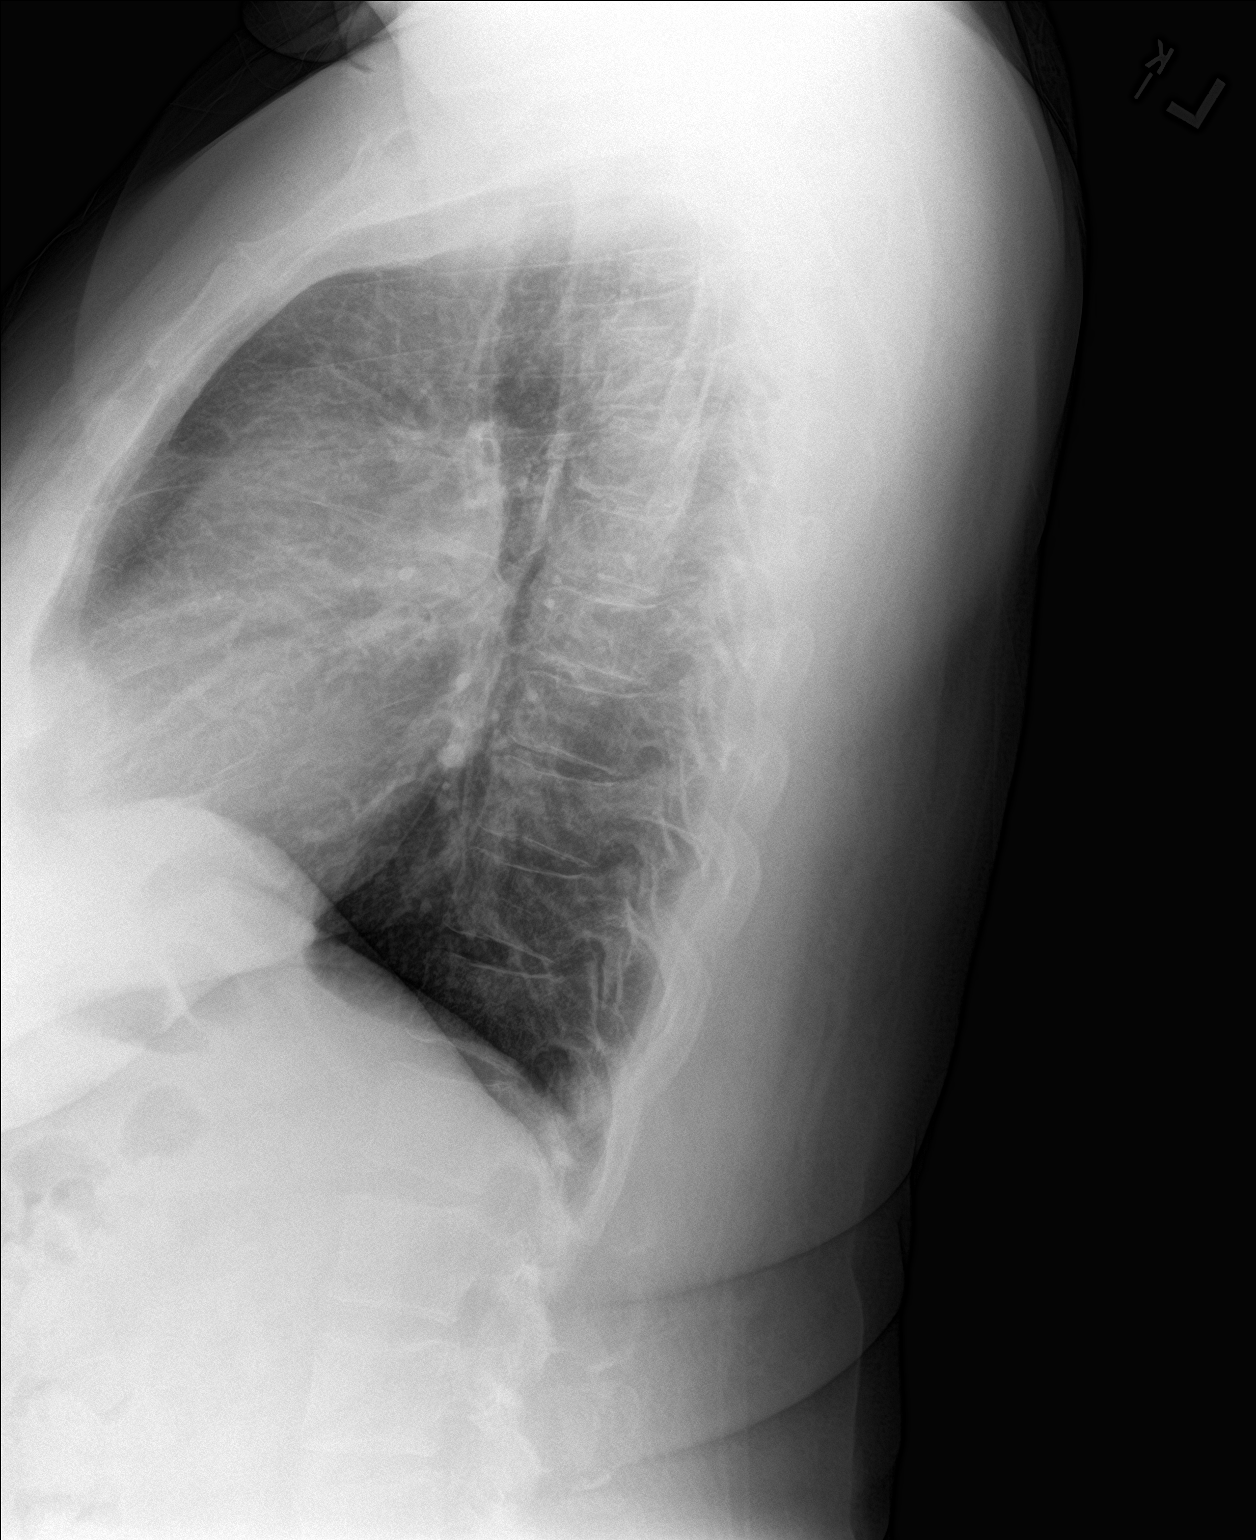

[2 of 2 positions shown; findings below may reference images not displayed]

FINDINGS: There is no edema or consolidation. Heart size and pulmonary
vascularity are normal. No adenopathy. No bone lesions.
IMPRESSION: No edema or consolidation.

## 2017-06-17 ENCOUNTER — Ambulatory Visit: Payer: Self-pay | Admitting: Family Medicine

## 2017-07-04 ENCOUNTER — Ambulatory Visit: Payer: Medicare Other | Admitting: Family Medicine

## 2017-08-30 ENCOUNTER — Other Ambulatory Visit: Payer: Self-pay | Admitting: Family Medicine

## 2017-08-30 DIAGNOSIS — E785 Hyperlipidemia, unspecified: Secondary | ICD-10-CM

## 2017-11-18 ENCOUNTER — Other Ambulatory Visit: Payer: Self-pay | Admitting: Family Medicine

## 2017-11-18 DIAGNOSIS — I10 Essential (primary) hypertension: Secondary | ICD-10-CM

## 2017-12-09 ENCOUNTER — Ambulatory Visit: Payer: Self-pay | Admitting: Internal Medicine

## 2017-12-16 ENCOUNTER — Ambulatory Visit: Payer: Medicare Other | Admitting: Family Medicine

## 2017-12-16 ENCOUNTER — Ambulatory Visit (HOSPITAL_BASED_OUTPATIENT_CLINIC_OR_DEPARTMENT_OTHER): Payer: Medicare Other | Attending: Internal Medicine | Admitting: Internal Medicine

## 2017-12-16 ENCOUNTER — Encounter: Payer: Self-pay | Admitting: Internal Medicine

## 2017-12-16 ENCOUNTER — Ambulatory Visit: Payer: Medicare Other | Admitting: Internal Medicine

## 2017-12-16 ENCOUNTER — Other Ambulatory Visit (INDEPENDENT_AMBULATORY_CARE_PROVIDER_SITE_OTHER): Payer: Medicare Other

## 2017-12-16 ENCOUNTER — Encounter: Payer: Self-pay | Admitting: Family Medicine

## 2017-12-16 VITALS — BP 130/88 | HR 60 | Ht 62.0 in | Wt 285.0 lb

## 2017-12-16 VITALS — BP 140/85 | HR 60 | Temp 97.6°F | Resp 16 | Ht 62.0 in | Wt 286.4 lb

## 2017-12-16 VITALS — Ht 62.0 in | Wt 285.0 lb

## 2017-12-16 DIAGNOSIS — I749 Embolism and thrombosis of unspecified artery: Secondary | ICD-10-CM | POA: Diagnosis not present

## 2017-12-16 DIAGNOSIS — R0609 Other forms of dyspnea: Secondary | ICD-10-CM

## 2017-12-16 DIAGNOSIS — G4733 Obstructive sleep apnea (adult) (pediatric): Secondary | ICD-10-CM

## 2017-12-16 DIAGNOSIS — G4752 REM sleep behavior disorder: Secondary | ICD-10-CM | POA: Diagnosis not present

## 2017-12-16 DIAGNOSIS — F418 Other specified anxiety disorders: Secondary | ICD-10-CM | POA: Diagnosis not present

## 2017-12-16 DIAGNOSIS — E1165 Type 2 diabetes mellitus with hyperglycemia: Secondary | ICD-10-CM

## 2017-12-16 DIAGNOSIS — I270 Primary pulmonary hypertension: Secondary | ICD-10-CM | POA: Diagnosis not present

## 2017-12-16 DIAGNOSIS — E662 Morbid (severe) obesity with alveolar hypoventilation: Secondary | ICD-10-CM

## 2017-12-16 DIAGNOSIS — R0683 Snoring: Secondary | ICD-10-CM | POA: Diagnosis not present

## 2017-12-16 DIAGNOSIS — E785 Hyperlipidemia, unspecified: Secondary | ICD-10-CM | POA: Diagnosis not present

## 2017-12-16 DIAGNOSIS — E669 Obesity, unspecified: Secondary | ICD-10-CM | POA: Diagnosis not present

## 2017-12-16 DIAGNOSIS — I10 Essential (primary) hypertension: Secondary | ICD-10-CM

## 2017-12-16 DIAGNOSIS — E1151 Type 2 diabetes mellitus with diabetic peripheral angiopathy without gangrene: Secondary | ICD-10-CM | POA: Diagnosis not present

## 2017-12-16 DIAGNOSIS — IMO0002 Reserved for concepts with insufficient information to code with codable children: Secondary | ICD-10-CM

## 2017-12-16 LAB — LIPID PANEL
CHOL/HDL RATIO: 2
Cholesterol: 172 mg/dL (ref 0–200)
HDL: 71.4 mg/dL (ref >39.00)
LDL Cholesterol: 85 mg/dL (ref 0–99)
NonHDL: 100.23
Triglycerides: 74 mg/dL (ref 0.0–149.0)
VLDL: 14.8 mg/dL (ref 0.0–40.0)

## 2017-12-16 LAB — BASIC METABOLIC PANEL
BUN: 11 mg/dL (ref 6–23)
CHLORIDE: 100 meq/L (ref 96–112)
CO2: 33 meq/L — AB (ref 19–32)
CREATININE: 0.79 mg/dL (ref 0.40–1.20)
Calcium: 9.4 mg/dL (ref 8.4–10.5)
GFR: 93.35 mL/min (ref >60.00)
Glucose, Bld: 109 mg/dL — ABNORMAL HIGH (ref 70–99)
POTASSIUM: 4.4 meq/L (ref 3.5–5.1)
Sodium: 139 mEq/L (ref 135–145)

## 2017-12-16 LAB — COMPREHENSIVE METABOLIC PANEL
ALT: 13 U/L (ref 0–35)
AST: 17 U/L (ref 0–37)
Albumin: 3.8 g/dL (ref 3.5–5.2)
Alkaline Phosphatase: 116 U/L (ref 39–117)
BILIRUBIN TOTAL: 1 mg/dL (ref 0.2–1.2)
BUN: 11 mg/dL (ref 6–23)
CALCIUM: 9.2 mg/dL (ref 8.4–10.5)
CO2: 33 mEq/L — ABNORMAL HIGH (ref 19–32)
CREATININE: 0.79 mg/dL (ref 0.40–1.20)
Chloride: 99 mEq/L (ref 96–112)
GFR: 93.35 mL/min (ref >60.00)
Glucose, Bld: 128 mg/dL — ABNORMAL HIGH (ref 70–99)
Potassium: 3.8 mEq/L (ref 3.5–5.1)
Sodium: 137 mEq/L (ref 135–145)
Total Protein: 7 g/dL (ref 6.0–8.3)

## 2017-12-16 LAB — CBC WITH DIFFERENTIAL/PLATELET
BASOS PCT: 1 % (ref 0.0–3.0)
Basophils Absolute: 0 10*3/uL (ref 0.0–0.1)
EOS ABS: 0.2 10*3/uL (ref 0.0–0.7)
Eosinophils Relative: 4.5 % (ref 0.0–5.0)
HCT: 41.8 % (ref 36.0–46.0)
HEMOGLOBIN: 13.5 g/dL (ref 12.0–15.0)
LYMPHS PCT: 42 % (ref 12.0–46.0)
Lymphs Abs: 2 10*3/uL (ref 0.7–4.0)
MCHC: 32.4 g/dL (ref 30.0–36.0)
MCV: 86.7 fl (ref 78.0–100.0)
MONOS PCT: 9.3 % (ref 3.0–12.0)
Monocytes Absolute: 0.4 10*3/uL (ref 0.1–1.0)
Neutro Abs: 2.1 10*3/uL (ref 1.4–7.7)
Neutrophils Relative %: 43.2 % (ref 43.0–77.0)
Platelets: 241 10*3/uL (ref 150.0–400.0)
RBC: 4.82 Mil/uL (ref 3.87–5.11)
RDW: 13.4 % (ref 11.5–15.5)
WBC: 4.8 10*3/uL (ref 4.0–10.5)

## 2017-12-16 LAB — HEMOGLOBIN A1C: Hgb A1c MFr Bld: 7 % — ABNORMAL HIGH (ref 4.6–6.5)

## 2017-12-16 MED ORDER — AMLODIPINE BESYLATE 5 MG PO TABS: 5.0000 mg | ORAL_TABLET | Freq: Every day | ORAL | 0 refills | Status: DC

## 2017-12-16 MED ORDER — SERTRALINE HCL 100 MG PO TABS: 100.0000 mg | ORAL_TABLET | Freq: Every day | ORAL | 3 refills | Status: DC

## 2017-12-16 MED ORDER — ATORVASTATIN CALCIUM 20 MG PO TABS: ORAL_TABLET | ORAL | 0 refills | Status: DC

## 2017-12-16 MED ORDER — SPIRONOLACTONE 50 MG PO TABS: ORAL_TABLET | ORAL | 1 refills | Status: DC

## 2017-12-16 NOTE — Patient Instructions (Signed)
Order- schedule PFT   Dx Dyspnea on exertion  Order- schedule NPSG split protocol   Dx OSA   Order- lab- CBC w diff, D-dimer, BMET     Dx Dyspnea on exertion  Order- Echocardiogram     Dx pulmonary hypertension, chronic thromboembolism  (If it can't be scheduled while you are here, I suggest you ask yor family doctor about getting it done at home.

## 2017-12-16 NOTE — Assessment & Plan Note (Signed)
Well controlled, no changes to meds. Encouraged heart healthy diet such as the DASH diet and exercise as tolerated.  °

## 2017-12-16 NOTE — Patient Instructions (Signed)

## 2017-12-16 NOTE — Progress Notes (Signed)
HPI  female never smoker followed for sleep hypoxemia, OSA/failed CPAP, OHS nightmares, complicated by history facial swelling, morbid obesity NPSG 05/11/12-[facility]-  Mild obstructive sleep apnea, AHI 14.9 per hour with body weight 288 pounds.PLMI 5.3/ hr. CPAP titration 06/03/12 to 11. Allergy profile- 03/11/14- Total IgE 863.9, + dust, box elder ONOX 03/11/14 qualified for sleep O2 2L Office Spirometry 12/10/2016-normal spirometry-FVC 1.80/79%, FEV1 1.44/82%, ratio 0.80 ---------------------------------------------------------------  12/10/2016-67 year old female never smoker followed for OSA/failed CPAP, OHS, nightmares, complicated by history facial swelling, morbid obesity O2 2 L sleep/[facility] FOLLOWS YNW:GNFAO Flu shot today. Pt denies any wheezing, cough, SOB or congestion. Pt contines to wear O2 QHS as well. Occasional mild chest tightness especially when anxious, only occasional use of rescue inhaler. Little cough or wheeze. If sleeps without oxygen she feels uncomfortable and wakes up "screaming". Sleeps much better with amitriptyline and occasional Xanax. No daytime oversedation. Main stress has been dealing with family health issues-sister in a facility with dementia, mother aged. Office Spirometry 12/10/2016-normal spirometry-FVC 1.80/79%, FEV1 1.44/82%, ratio 0.80  12/16/17- 67 year old female never smoker followed for OSA/failed CPAP, OHS, nightmares, complicated by history facial swelling, morbid obesity O2 2 L sleep/[facility] ----yearly ROV, shortness of breath doing ADLs, oxygen at HS  Had previously failed to tolerate CPAP-claustrophobic mask. ProAir HFA, nebulizer DuoNeb, Drove here from [place], staying overnight with daughter.  Has had family stress and interrupted sleep with a regular sleep schedule, caring for to family members with special needs.,  Complains of easy dyspnea on exertion.  Has to lie down and rest even after simple activities like getting dressed or bowel  movement.  Denies chest pain, cough or wheeze, palpitation, obvious fluid retention, or recent acute event.  Remote history of superficial thrombophlebitis.  Has not been anemic. CXR 12/10/16- There is no edema or consolidation. Heart size and pulmonary vascularity are normal. No adenopathy. No bone lesions.  ROS-see HPI    + = pos Constitutional:    weight loss, night sweats, fevers, chills, fatigue, lassitude. HEENT:   + headaches, no-difficulty swallowing, tooth/dental problems, sore throat,       No-  sneezing, itching, ear ache, +nasal congestion, post nasal drip,  CV:  No-   chest pain, orthopnea, PND, swelling in lower extremities, anasarca,                                                       dizziness, palpitations Resp: +shortness of breath with exertion or at rest.              No-   productive cough,  No non-productive cough,  No- coughing up of blood.              No-   change in color of mucus.  No- wheezing.   Skin: No-   rash or lesions. GI:  No-   heartburn, indigestion, abdominal pain, nausea, vomiting,  GU:  MS:  No-   joint pain or swelling.   Neuro-     nothing unusual Psych:  No- change in mood or affect. No depression + anxiety.  No memory loss.  OBJ- Physical Exam General- Alert, Oriented, Affect-appropriate, Distress- none acute,+ morbidly obese 285 pounds/BMI 52.13 Skin- rash-none, lesions- none, excoriation- none Lymphadenopathy- none Head- atraumatic            Eyes- Gross  vision intact, PERRLA, conjunctivae and secretions clear,                    + periorbital  Fullness- edema or fat            Ears- Hearing, canals-normal            Nose- Clear, no-Septal dev, mucus, polyps, erosion, perforation             Throat- Mallampati II , mucosa clear , drainage- none, tonsils+ present Neck- flexible , trachea midline, no stridor , thyroid nl, carotid no bruit Chest - symmetrical excursion , unlabored           Heart/CV- RRR , no murmur , no gallop  , no rub,  nl s1 s2                           - JVD- none , edema- none, stasis changes- none, varices- none           Lung- clear to P&A, wheeze- none, cough- none , dullness-none, rub- none           Chest wall-  Abd-  Br/ Gen/ Rectal- Not done, not indicated Extrem- cyanosis- none, clubbing, none, atrophy- none, strength- nl. Neuro- grossly intact to observation

## 2017-12-16 NOTE — Assessment & Plan Note (Signed)
Tolerating statin, encouraged heart healthy diet, avoid trans fats, minimize simple carbs and saturated fats. Increase exercise as tolerated 

## 2017-12-16 NOTE — Progress Notes (Signed)
Patient ID: Amy Mcgee, female    DOB: [birth year]  Age: 67 y.o. MRN: [id number]    Subjective:  Subjective  HPI Amy Mcgee presents for f/u dm, cholesterol and htn  HPI HYPERTENSION   Blood pressure range-not checking   Chest pain- no      Dyspnea- no Lightheadedness- no   Edema- no worse   Other side effects - no   Medication compliance: good Low salt diet- yes    DIABETES    Blood Sugar ranges-90-130  Polyuria- no New Visual problems- no  Hypoglycemic symptoms- no  Other side effects-no Medication compliance - good Last eye exam- last year Foot exam- today   HYPERLIPIDEMIA  Medication compliance- good RUQ pain- no  Muscle aches- no Other side effects-no   Review of Systems  Constitutional: Negative for appetite change, diaphoresis, fatigue and unexpected weight change.  Eyes: Negative for pain, redness and visual disturbance.  Respiratory: Negative for cough, chest tightness, shortness of breath and wheezing.   Cardiovascular: Negative for chest pain, palpitations and leg swelling.  Endocrine: Negative for cold intolerance, heat intolerance, polydipsia, polyphagia and polyuria.  Genitourinary: Negative for difficulty urinating, dysuria and frequency.  Neurological: Negative for dizziness, light-headedness, numbness and headaches.    History Past Medical History:  Diagnosis Date  . Diabetes mellitus without complication (HCC)   . Hypercholesterolemia   . Hypertension   . Sinusitis     She has a past surgical history that includes Spine surgery (2002); Joint replacement; Abdominal hysterectomy (1999); and Carpal tunnel release (Left, 04/2016).   Her family history includes Allergies in her brother, brother, daughter, maternal aunt, and mother; Stroke in her mother.She reports that  has never smoked. she has never used smokeless tobacco. She reports that she does not drink alcohol or use drugs.  Current Outpatient Medications on File Prior to Visit    Medication Sig Dispense Refill  . ACCU-CHEK SOFTCLIX LANCETS lancets TEST BLOOD SUGAR ONCE DAILY 100 each 3  . albuterol (PROAIR HFA) 108 (90 Base) MCG/ACT inhaler Inhale 2 puffs into the lungs every 6 (six) hours as needed for wheezing or shortness of breath. 1 Inhaler 11  . ALPRAZolam (XANAX) 0.25 MG tablet TAKE 1 TABLET BY MOUTH THREE TIMES DAILY AS NEEDED FOR ANXIETY OR SLEEP 30 tablet 5  . amitriptyline (ELAVIL) 10 MG tablet 1-3 at bedtime as directed for nightmares 31 tablet 12  . cetirizine (ZYRTEC) 10 MG tablet Take 10 mg by mouth daily.    . cholecalciferol (VITAMIN D) 1000 UNITS tablet Take 1,000 Units by mouth daily.    . cyclobenzaprine (FLEXERIL) 10 MG tablet Take 10 mg by mouth 3 (three) times daily as needed for muscle spasms.    . furosemide (LASIX) 40 MG tablet Take 1 tablet (40 mg total) by mouth as needed. 90 tablet 1  . glucose blood (ACCU-CHEK AVIVA PLUS) test strip Check blood sugar once daily 100 each 3  . ipratropium-albuterol (DUONEB) 0.5-2.5 (3) MG/3ML SOLN Take 3 mLs by nebulization every 6 (six) hours as needed.    . metaxalone (SKELAXIN) 800 MG tablet   2  . metFORMIN (GLUCOPHAGE) 500 MG tablet TAKE 1 TABLET BY MOUTH TWICE DAILY WITH A MEAL 180 tablet 1  . pantoprazole (PROTONIX) 40 MG tablet Take 40 mg by mouth daily.    . Potassium Chloride ER 20 MEQ TBCR 1 po qd 90 tablet 3   No current facility-administered medications on file prior to visit.      Objective:  Objective  Physical Exam  Constitutional: She is oriented to person, place, and time. She appears well-developed and well-nourished.  HENT:  Head: Normocephalic and atraumatic.  Eyes: Conjunctivae and EOM are normal.  Neck: Normal range of motion. Neck supple. No JVD present. Carotid bruit is not present. No thyromegaly present.  Cardiovascular: Normal rate, regular rhythm and normal heart sounds.  No murmur heard. Pulmonary/Chest: Effort normal and breath sounds normal. No respiratory distress.  She has no wheezes. She has no rales. She exhibits no tenderness.  Musculoskeletal: She exhibits no edema.  Neurological: She is alert and oriented to person, place, and time.  Psychiatric: She has a normal mood and affect. Her behavior is normal. Judgment and thought content normal.  Nursing note and vitals reviewed.  BP 140/85 (BP Location: Left Arm, Patient Position: Sitting, Cuff Size: Large)   Pulse 60   Temp 97.6 F (36.4 C) (Oral)   Resp 16   Ht 5\' 2"  (1.575 m)   Wt 286 lb 6.4 oz (129.9 kg)   SpO2 100%   BMI 52.38 kg/m  Wt Readings from Last 3 Encounters:  12/16/17 286 lb 6.4 oz (129.9 kg)  12/16/17 285 lb (129.3 kg)  05/21/17 272 lb (123.4 kg)     Lab Results  Component Value Date   WBC 4.8 12/16/2017   HGB 13.5 12/16/2017   HCT 41.8 12/16/2017   PLT 241.0 12/16/2017   GLUCOSE 128 (H) 12/16/2017   CHOL 172 12/16/2017   TRIG 74.0 12/16/2017   HDL 71.40 12/16/2017   LDLCALC 85 12/16/2017   ALT 13 12/16/2017   AST 17 12/16/2017   NA 137 12/16/2017   K 3.8 12/16/2017   CL 99 12/16/2017   CREATININE 0.79 12/16/2017   BUN 11 12/16/2017   CO2 33 (H) 12/16/2017   TSH 2.51 02/04/2014   HGBA1C 7.0 (H) 12/16/2017   MICROALBUR 1.3 05/20/2017    Dg Chest 2 View  Result Date: 12/10/2016 CLINICAL DATA:  Shortness of breath.  Hypertension. EXAM: CHEST  2 VIEW COMPARISON:  May 18, 2014 FINDINGS: There is no edema or consolidation. Heart size and pulmonary vascularity are normal. No adenopathy. No bone lesions. IMPRESSION: No edema or consolidation. Electronically Signed   By: Bretta Bang III M.D.   On: 12/10/2016 14:24     Assessment & Plan:  Plan  I am having Amy Mcgee maintain her ipratropium-albuterol, cholecalciferol, cetirizine, metaxalone, glucose blood, ACCU-CHEK SOFTCLIX LANCETS, albuterol, ALPRAZolam, amitriptyline, pantoprazole, metFORMIN, furosemide, Potassium Chloride ER, cyclobenzaprine, amLODipine, atorvastatin, sertraline, and  spironolactone.  Meds ordered this encounter  Medications  . amLODipine (NORVASC) 5 MG tablet    Sig: Take 1 tablet (5 mg total) by mouth daily.    Dispense:  90 tablet    Refill:  0  . atorvastatin (LIPITOR) 20 MG tablet    Sig: TAKE 1 TABLET(20 MG) BY MOUTH DAILY    Dispense:  90 tablet    Refill:  0  . sertraline (ZOLOFT) 100 MG tablet    Sig: Take 1 tablet (100 mg total) by mouth daily.    Dispense:  90 tablet    Refill:  3  . spironolactone (ALDACTONE) 50 MG tablet    Sig: TAKE 1 TABLET(50 MG) BY MOUTH DAILY    Dispense:  90 tablet    Refill:  1    Problem List Items Addressed This Visit      Unprioritized   DM (diabetes mellitus) type II uncontrolled, periph vascular disorder (HCC) - Primary  hgba1c to be checked, minimize simple carbs. Increase exercise as tolerated. Continue current meds      Relevant Medications   amLODipine (NORVASC) 5 MG tablet   atorvastatin (LIPITOR) 20 MG tablet   spironolactone (ALDACTONE) 50 MG tablet   Other Relevant Orders   Lipid panel (Completed)   Hemoglobin A1c (Completed)   Comprehensive metabolic panel (Completed)   Essential hypertension    Well controlled, no changes to meds. Encouraged heart healthy diet such as the DASH diet and exercise as tolerated.       Relevant Medications   amLODipine (NORVASC) 5 MG tablet   atorvastatin (LIPITOR) 20 MG tablet   spironolactone (ALDACTONE) 50 MG tablet   Other Relevant Orders   Lipid panel (Completed)   Hemoglobin A1c (Completed)   Comprehensive metabolic panel (Completed)   Hyperlipidemia    Tolerating statin, encouraged heart healthy diet, avoid trans fats, minimize simple carbs and saturated fats. Increase exercise as tolerated      Relevant Medications   amLODipine (NORVASC) 5 MG tablet   atorvastatin (LIPITOR) 20 MG tablet   spironolactone (ALDACTONE) 50 MG tablet   Other Relevant Orders   Lipid panel (Completed)   Hemoglobin A1c (Completed)   Comprehensive metabolic  panel (Completed)    Other Visit Diagnoses    Depression with anxiety       Relevant Medications   sertraline (ZOLOFT) 100 MG tablet      Follow-up: Return in about 6 months (around 06/15/2018), or if symptoms worsen or fail to improve, for annual exam, fasting.  Donato SchultzYvonne R Lowne Chase, DO

## 2017-12-16 NOTE — Assessment & Plan Note (Signed)
hgba1c to be checked, minimize simple carbs. Increase exercise as tolerated. Continue current meds  

## 2017-12-17 ENCOUNTER — Other Ambulatory Visit: Payer: Self-pay

## 2017-12-17 ENCOUNTER — Ambulatory Visit (HOSPITAL_COMMUNITY): Payer: Medicare Other | Attending: Cardiology

## 2017-12-17 DIAGNOSIS — R0609 Other forms of dyspnea: Secondary | ICD-10-CM | POA: Insufficient documentation

## 2017-12-17 DIAGNOSIS — I749 Embolism and thrombosis of unspecified artery: Secondary | ICD-10-CM | POA: Insufficient documentation

## 2017-12-17 DIAGNOSIS — E1165 Type 2 diabetes mellitus with hyperglycemia: Principal | ICD-10-CM

## 2017-12-17 DIAGNOSIS — I340 Nonrheumatic mitral (valve) insufficiency: Secondary | ICD-10-CM | POA: Diagnosis not present

## 2017-12-17 DIAGNOSIS — E119 Type 2 diabetes mellitus without complications: Secondary | ICD-10-CM | POA: Insufficient documentation

## 2017-12-17 DIAGNOSIS — E669 Obesity, unspecified: Secondary | ICD-10-CM | POA: Diagnosis not present

## 2017-12-17 DIAGNOSIS — I270 Primary pulmonary hypertension: Secondary | ICD-10-CM | POA: Insufficient documentation

## 2017-12-17 DIAGNOSIS — IMO0002 Reserved for concepts with insufficient information to code with codable children: Secondary | ICD-10-CM

## 2017-12-17 DIAGNOSIS — I10 Essential (primary) hypertension: Secondary | ICD-10-CM | POA: Insufficient documentation

## 2017-12-17 DIAGNOSIS — R0602 Shortness of breath: Secondary | ICD-10-CM

## 2017-12-17 DIAGNOSIS — E1151 Type 2 diabetes mellitus with diabetic peripheral angiopathy without gangrene: Secondary | ICD-10-CM

## 2017-12-17 DIAGNOSIS — E785 Hyperlipidemia, unspecified: Secondary | ICD-10-CM | POA: Diagnosis not present

## 2017-12-17 LAB — ECHOCARDIOGRAM COMPLETE
CHL CUP DOP CALC LVOT VTI: 18.8 cm
EERAT: 8.27
EWDT: 310 ms
FS: 48 % — AB (ref 28–44)
IVS/LV PW RATIO, ED: 1
LA diam end sys: 38 mm
LA vol A4C: 27.2 ml
LA vol index: 19.4 mL/m2
LADIAMINDEX: 1.71 cm/m2
LASIZE: 38 mm
LAVOL: 43 mL
LV TDI E'MEDIAL: 9.24
LV e' LATERAL: 10.2 cm/s
LVEEAVG: 8.27
LVEEMED: 8.27
LVOT SV: 59 mL
LVOT area: 3.14 cm2
LVOT diameter: 20 mm
LVOTPV: 78.3 cm/s
Lateral S' vel: 10.8 cm/s
MV Dec: 310
MV Peak grad: 3 mmHg
MV pk A vel: 38.3 m/s
MVPKEVEL: 84.4 m/s
PW: 12.8 mm — AB (ref 0.6–1.1)
RV TAPSE: 32.1 mm
RV sys press: 26 mmHg
Reg peak vel: 238 cm/s
TDI e' lateral: 10.2
TR max vel: 238 cm/s

## 2017-12-17 LAB — D-DIMER, QUANTITATIVE (NOT AT ARMC): D DIMER QUANT: 1.12 ug{FEU}/mL — AB (ref <0.50)

## 2017-12-17 NOTE — Assessment & Plan Note (Signed)
Severely overweight-discussed importance of working toward meaningful weight loss.  I do not know if bariatric surgery evaluation would be plausible, available in her home town.

## 2017-12-17 NOTE — Assessment & Plan Note (Signed)
BMI at this visit  52.  Her daughter is every bit as big as she is.  Realistically I doubt she will be able to achieve necessary change in lifestyle to accomplish useful weight loss.  Do not know if she could be a bariatric candidate.

## 2017-12-17 NOTE — Assessment & Plan Note (Signed)
Intolerant of CPAP in 2013.  We discussed alternatives including an oral appliance and I suggested she allow referral to learn about that as an option.  I would also like to update her sleep study and and hopefully she will consent to another trial of CPAP with newer masks and counseling. Plan-split study NPSG

## 2017-12-17 NOTE — Progress Notes (Signed)
Results given to patient, she is going to call back to schedule 3 months labs. Orders entered.

## 2017-12-17 NOTE — Assessment & Plan Note (Addendum)
Her dyspnea description is a little unusual.  It may just reflect morbid obesity, obesity hypoventilation, deconditioning.  Need to rule out CHF, chronic thromboembolic disease with pulmonary hypertension. Plan-labs including d-dimer, schedule PFT, schedule echocardiogram for evidence of right heart strain.  Coordinating the studies with [place] plans to [place]-she is going to consider how much she wants to do here as opposed to [facility] [place] [facility] [place] appropriate studies and referrals at home.

## 2017-12-28 DIAGNOSIS — G4733 Obstructive sleep apnea (adult) (pediatric): Secondary | ICD-10-CM | POA: Diagnosis not present

## 2017-12-28 NOTE — Procedures (Signed)
   Patient Name: Amy Mcgee, Antaniya Study Date: 12/16/2017 Gender: Female D.O.B: [birth year] Age (years): [age] Referring Provider: Jetty Duhamellinton Ismeal Heider MD, ABSM Height (inches): 62 Interpreting Physician: Jetty Duhamellinton Okley Magnussen MD, ABSM Weight (lbs): 285 RPSGT: Armen PickupFord, Evelyn BMI: 52 MRN: [id number] Neck Size: 15.00 <br> <br> CLINICAL INFORMATION Sleep Study Type: NPSG  Indication for sleep study: OSA  Epworth Sleepiness Score: 3  SLEEP STUDY TECHNIQUE As per the AASM Manual for the Scoring of Sleep and Associated Events v2.3 (April 2016) with a hypopnea requiring 4% desaturations.  The channels recorded and monitored were frontal, central and occipital EEG, electrooculogram (EOG), submentalis EMG (chin), nasal and oral airflow, thoracic and abdominal wall motion, anterior tibialis EMG, snore microphone, electrocardiogram, and pulse oximetry.  MEDICATIONS Medications self-administered by patient taken the night of the study : none reported  SLEEP ARCHITECTURE The study was initiated at 11:02:25 PM and ended at 4:58:33 AM.  Sleep onset time was 34.0 minutes and the sleep efficiency was 78.3%. The total sleep time was 279.0 minutes.  Stage REM latency was 128.5 minutes.  The patient spent 5.20% of the night in stage N1 sleep, 82.44% in stage N2 sleep, 0.36% in stage N3 and 12.01% in REM.  Alpha intrusion was absent.  Supine sleep was 58.85%.  RESPIRATORY PARAMETERS The overall apnea/hypopnea index (AHI) was 0.0 per hour. There were 0 total apneas, including 0 obstructive, 0 central and 0 mixed apneas. There were 0 hypopneas and 28 RERAs.  The AHI during Stage REM sleep was 0.0 per hour.  AHI while supine was 0.0 per hour.  The mean oxygen saturation was 96.98%. The minimum SpO2 during sleep was 90.00%.  moderate snoring was noted during this study.  CARDIAC DATA The 2 lead EKG demonstrated sinus rhythm with PACs. The mean heart rate was 65.31 beats per minute. Other EKG findings include:  None.  LEG MOVEMENT DATA The total PLMS were 0 with a resulting PLMS index of 0.00. Associated arousal with leg movement index was 0.0 .  IMPRESSIONS - No significant obstructive sleep apnea occurred during this study (AHI = 0.0/h). - No significant central sleep apnea occurred during this study (CAI = 0.0/h). - The patient had minimal or no oxygen desaturation during the study (Min O2 = 90.00%) - The patient snored with moderate snoring volume. - No cardiac abnormalities were noted during this study. - Clinically significant periodic limb movements did not occur during sleep. No significant associated arousals. - During REM, the patient was noted to scream and kick her legs.  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 - Be careful with alcohol, sedatives and other CNS depressants that may worsen sleep apnea and disrupt normal sleep architecture. - Sleep hygiene should be reviewed to assess factors that may improve sleep quality. - Weight management and regular exercise should be initiated or continued if appropriate.  [Electronically signed] 12/28/2017 12:05 PM  Jetty Duhamellinton Kamil Mchaffie MD, ABSM Diplomate, American Board of Sleep Medicine   NPI: [id number]                          Jetty Duhamellinton Shandi Godfrey Diplomate, American Board of Sleep Medicine  ELECTRONICALLY SIGNED ON:  12/28/2017, 11:55 AM [facility] PH: [phone]   FX: [phone] ACCREDITED BY THE AMERICAN ACADEMY OF SLEEP MEDICINE

## 2018-02-14 ENCOUNTER — Ambulatory Visit: Payer: Medicare Other | Admitting: Family Medicine

## 2018-05-09 ENCOUNTER — Ambulatory Visit: Payer: Medicare Other | Admitting: Family Medicine

## 2018-05-09 ENCOUNTER — Encounter: Payer: Self-pay | Admitting: Internal Medicine

## 2018-05-09 ENCOUNTER — Ambulatory Visit: Payer: Medicare Other | Admitting: Internal Medicine

## 2018-05-09 ENCOUNTER — Ambulatory Visit (INDEPENDENT_AMBULATORY_CARE_PROVIDER_SITE_OTHER): Payer: Medicare Other | Admitting: Internal Medicine

## 2018-05-09 ENCOUNTER — Encounter: Payer: Self-pay | Admitting: Family Medicine

## 2018-05-09 ENCOUNTER — Other Ambulatory Visit: Payer: Self-pay | Admitting: Family Medicine

## 2018-05-09 VITALS — BP 126/75 | HR 55 | Temp 97.6°F | Resp 16 | Wt 281.2 lb

## 2018-05-09 DIAGNOSIS — E1151 Type 2 diabetes mellitus with diabetic peripheral angiopathy without gangrene: Secondary | ICD-10-CM

## 2018-05-09 DIAGNOSIS — M62838 Other muscle spasm: Secondary | ICD-10-CM

## 2018-05-09 DIAGNOSIS — R0609 Other forms of dyspnea: Secondary | ICD-10-CM

## 2018-05-09 DIAGNOSIS — E1165 Type 2 diabetes mellitus with hyperglycemia: Secondary | ICD-10-CM

## 2018-05-09 DIAGNOSIS — G4733 Obstructive sleep apnea (adult) (pediatric): Secondary | ICD-10-CM

## 2018-05-09 DIAGNOSIS — G4752 REM sleep behavior disorder: Secondary | ICD-10-CM | POA: Diagnosis not present

## 2018-05-09 DIAGNOSIS — I10 Essential (primary) hypertension: Secondary | ICD-10-CM

## 2018-05-09 DIAGNOSIS — E785 Hyperlipidemia, unspecified: Secondary | ICD-10-CM

## 2018-05-09 DIAGNOSIS — IMO0002 Reserved for concepts with insufficient information to code with codable children: Secondary | ICD-10-CM

## 2018-05-09 LAB — PULMONARY FUNCTION TEST
DL/VA % PRED: 98 %
DL/VA: 4.36 ml/min/mmHg/L
DLCO UNC % PRED: 77 %
DLCO unc: 15.73 ml/min/mmHg
FEF 25-75 POST: 1.56 L/s
FEF 25-75 PRE: 1.15 L/s
FEF2575-%CHANGE-POST: 36 %
FEF2575-%PRED-PRE: 72 %
FEF2575-%Pred-Post: 98 %
FEV1-%Change-Post: 5 %
FEV1-%Pred-Post: 95 %
FEV1-%Pred-Pre: 90 %
FEV1-PRE: 1.49 L
FEV1-Post: 1.57 L
FEV1FVC-%CHANGE-POST: 4 %
FEV1FVC-%PRED-PRE: 100 %
FEV6-%Change-Post: 2 %
FEV6-%Pred-Post: 93 %
FEV6-%Pred-Pre: 91 %
FEV6-Post: 1.91 L
FEV6-Pre: 1.87 L
FEV6FVC-%Change-Post: 1 %
FEV6FVC-%Pred-Post: 104 %
FEV6FVC-%Pred-Pre: 102 %
FVC-%CHANGE-POST: 0 %
FVC-%PRED-PRE: 89 %
FVC-%Pred-Post: 89 %
FVC-POST: 1.91 L
FVC-PRE: 1.89 L
POST FEV1/FVC RATIO: 82 %
PRE FEV1/FVC RATIO: 79 %
Post FEV6/FVC ratio: 100 %
Pre FEV6/FVC Ratio: 99 %
RV % pred: 86 %
RV: 1.73 L
TLC % pred: 98 %
TLC: 4.53 L

## 2018-05-09 MED ORDER — ATORVASTATIN CALCIUM 20 MG PO TABS: ORAL_TABLET | ORAL | 0 refills | Status: DC

## 2018-05-09 MED ORDER — TIZANIDINE HCL 4 MG PO TABS: 4.0000 mg | ORAL_TABLET | Freq: Four times a day (QID) | ORAL | 0 refills | Status: AC | PRN

## 2018-05-09 NOTE — Patient Instructions (Signed)
We suggested you might try drinking a glass of tonic water (from grocery store), flavored as needed with orange juice or tomato juice. Try a glass or 2 daily for a few days to see if it helps the cramps.

## 2018-05-09 NOTE — Patient Instructions (Signed)

## 2018-05-09 NOTE — Progress Notes (Signed)
Patient completed full PFT today. Patient did have a very hard time on pre spiro, she was unable to complete/pass three tests. Patient had hard time blasting out the air.

## 2018-05-09 NOTE — Progress Notes (Signed)
HPI  female never smoker followed for sleep hypoxemia, OSA/failed CPAP, OHS nightmares, complicated by history facial swelling, morbid obesity NPSG 05/11/12-[facility]-  Mild obstructive sleep apnea, AHI 14.9 per hour with body weight 288 pounds.PLMI 5.3/ hr. CPAP titration 06/03/12 to 11. Allergy profile- 03/11/14- Total IgE 863.9, + dust, box elder ONOX 03/11/14 qualified for sleep O2 2L Office Spirometry 12/10/2016-normal spirometry-FVC 1.80/79%, FEV1 1.44/82%, ratio 0.80 NPSG-12/16/2017-AHI 0.0/hour, desaturation to 90%, moderate snoring.  Patient was noted to scream and kick her legs during REM, c/w REM Behavior Disorder ---------------------------------------------------------------  12/16/17- 67 year old female never smoker followed for OSA/failed CPAP, OHS, nightmares, complicated by history facial swelling, morbid obesity O2 2 L sleep/[facility] ----yearly ROV, shortness of breath doing ADLs, oxygen at HS  Had previously failed to tolerate CPAP-claustrophobic mask. ProAir HFA, nebulizer DuoNeb, [place], staying overnight with daughter.  Has had family stress and interrupted sleep with a regular sleep schedule, caring for to family members with special needs.,  Complains of easy dyspnea on exertion.  Has to lie down and rest even after simple activities like getting dressed or bowel movement.  Denies chest pain, cough or wheeze, palpitation, obvious fluid retention, or recent acute event.  Remote history of superficial thrombophlebitis.  Has not been anemic. CXR 12/10/16- There is no edema or consolidation. Heart size and pulmonary vascularity are normal. No adenopathy. No bone lesions.  05/09/2018- 67 year old female never smoker followed for OSA/failed CPAP, OHS, nightmares, complicated by history facial swelling, morbid obesity O2 2 L sleep/[facility] -----Pt has increase of SOB with exertion. 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 Pt has increased chest tightness, and some wheezing.  She and her daughter both note that she sleeps more calmly when she wears oxygen.  She has been taking amitriptyline at at bedtime, and occasionally Xanax. PFT-05/09/2018-minimal diffusion defect, minimal obstruction, no response to dilator, normal lung volumes. NPSG-12/16/2017-AHI 0.0/hour, desaturation to 90%, moderate snoring.  Patient was noted to scream and kick her legs during REM, c/w REM Behavior Disorder ECHO 12/17/17- 65-70%, No PHTN D-dimer  ROS-see HPI    + = pos Constitutional:    weight loss, night sweats, fevers, chills, fatigue, lassitude. HEENT:   + headaches, no-difficulty swallowing, tooth/dental problems, sore throat,       No-  sneezing, itching, ear ache, +nasal congestion, post nasal drip,  CV:  No-   chest pain, orthopnea, PND, swelling in lower extremities, anasarca,                                     dizziness, palpitations Resp: +shortness of breath with exertion or at rest.              No-   productive cough,  No non-productive cough,  No- coughing up of blood.              No-   change in color of mucus.  No- wheezing.   Skin: No-   rash or lesions. GI:  No-   heartburn, indigestion, abdominal pain, nausea, vomiting,  GU:  MS:  No-   joint pain or swelling.   Neuro-     nothing unusual Psych:  No- change in mood or affect. No depression + anxiety.  No memory loss.  OBJ- Physical Exam General- Alert, Oriented, Affect-appropriate, Distress- none acute,+ morbidly obese  Skin-  rash-none, lesions- none, excoriation- none Lymphadenopathy- none Head- atraumatic            Eyes- Gross vision intact, PERRLA, conjunctivae and secretions clear,                    + periorbital  Fullness- edema or fat            Ears- Hearing, canals-normal            Nose- Clear, no-Septal dev, mucus, polyps, erosion, perforation             Throat- Mallampati II , mucosa clear , drainage- none, tonsils+ present Neck-  flexible , trachea midline, no stridor , thyroid nl, carotid no bruit Chest - symmetrical excursion , unlabored           Heart/CV- RRR , no murmur , no gallop  , no rub, nl s1 s2                           - JVD- none , edema- none, stasis changes- none, varices- none           Lung- clear to P&A, wheeze- none, cough- none , dullness-none, rub- none           Chest wall-  Abd-  Br/ Gen/ Rectal- Not done, not indicated Extrem- cyanosis- none, clubbing, none, atrophy- none, strength- nl. Neuro- grossly intact to observation

## 2018-05-09 NOTE — Progress Notes (Signed)
Subjective:  I acted as a Neurosurgeon for [employer]. Fuller Song, RMA   Patient ID: Amy Mcgee, female    DOB: [birth year], 67 y.o.   MRN: [id number]  Chief Complaint  Patient presents with  . Diabetes    HPI  Patient is in today for follow up on diabetes. She has c/o night terrors and had a sleep study where she had night terror.  She saw Dr Maple Hudson this am.   Blood sugars running in low 100s in am.  Pt also asking to see someone for her feet.    Patient Care Team: Zola Button, Grayling Congress, DO as PCP - General (Family Medicine) Waymon Budge, MD as Consulting Physician (Pulmonary Disease)   Past Medical History:  Diagnosis Date  . Diabetes mellitus without complication (HCC)   . Hypercholesterolemia   . Hypertension   . Sinusitis     Past Surgical History:  Procedure Laterality Date  . ABDOMINAL HYSTERECTOMY  1999  . CARPAL TUNNEL RELEASE Left 04/2016  . JOINT REPLACEMENT     right 2008 and left 2003  knee  . SPINE SURGERY  2002   cervical    Family History  Problem Relation Age of Onset  . Allergies Mother   . Stroke Mother   . Allergies Maternal Aunt   . Allergies Brother   . Allergies Brother   . Allergies Daughter     Social History   Socioeconomic History  . Marital status: Divorced    Spouse name: Not on file  . Number of children: Not on file  . Years of education: Not on file  . Highest education level: Not on file  Occupational History  . Occupation: retired  Engineer, production  . Financial resource strain: Not on file  . Food insecurity:    Worry: Not on file    Inability: Not on file  . Transportation needs:    Medical: Not on file    Non-medical: Not on file  Tobacco Use  . Smoking status: Never Smoker  . Smokeless tobacco: Never Used  Substance and Sexual Activity  . Alcohol use: No  . Drug use: No  . Sexual activity: Never    Partners: Male  Lifestyle  . Physical activity:    Days per week: Not on file    Minutes per session: Not on  file  . Stress: Not on file  Relationships  . Social connections:    Talks on phone: Not on file    Gets together: Not on file    Attends religious service: Not on file    Active member of club or organization: Not on file    Attends meetings of clubs or organizations: Not on file    Relationship status: Not on file  . Intimate partner violence:    Fear of current or ex partner: Not on file    Emotionally abused: Not on file    Physically abused: Not on file    Forced sexual activity: Not on file  Other Topics Concern  . Not on file  Social History Narrative   Lives in [place] but comes to [place] to live with her daughter for several weeks at a time    Outpatient Medications Prior to Visit  Medication Sig Dispense Refill  . ACCU-CHEK SOFTCLIX LANCETS lancets TEST BLOOD SUGAR ONCE DAILY 100 each 3  . albuterol (PROAIR HFA) 108 (90 Base) MCG/ACT inhaler Inhale 2 puffs into the lungs every 6 (six) hours as needed for wheezing  or shortness of breath. 1 Inhaler 11  . ALPRAZolam (XANAX) 0.25 MG tablet TAKE 1 TABLET BY MOUTH THREE TIMES DAILY AS NEEDED FOR ANXIETY OR SLEEP 30 tablet 5  . amitriptyline (ELAVIL) 10 MG tablet 1-3 at bedtime as directed for nightmares 31 tablet 12  . amLODipine (NORVASC) 5 MG tablet Take 1 tablet (5 mg total) by mouth daily. 90 tablet 0  . cetirizine (ZYRTEC) 10 MG tablet Take 10 mg by mouth daily.    . cholecalciferol (VITAMIN D) 1000 UNITS tablet Take 1,000 Units by mouth daily.    . furosemide (LASIX) 40 MG tablet Take 1 tablet (40 mg total) by mouth as needed. 90 tablet 1  . glucose blood (ACCU-CHEK AVIVA PLUS) test strip Check blood sugar once daily 100 each 3  . ipratropium-albuterol (DUONEB) 0.5-2.5 (3) MG/3ML SOLN Take 3 mLs by nebulization every 6 (six) hours as needed.    . metFORMIN (GLUCOPHAGE) 500 MG tablet TAKE 1 TABLET BY MOUTH TWICE DAILY WITH A MEAL 180 tablet 1  . pantoprazole (PROTONIX) 40 MG tablet Take 40 mg by mouth daily.    . Potassium  Chloride ER 20 MEQ TBCR 1 po qd 90 tablet 3  . sertraline (ZOLOFT) 100 MG tablet Take 1 tablet (100 mg total) by mouth daily. 90 tablet 3  . spironolactone (ALDACTONE) 50 MG tablet TAKE 1 TABLET(50 MG) BY MOUTH DAILY 90 tablet 1  . atorvastatin (LIPITOR) 20 MG tablet TAKE 1 TABLET(20 MG) BY MOUTH DAILY 90 tablet 0  . cyclobenzaprine (FLEXERIL) 10 MG tablet Take 10 mg by mouth 3 (three) times daily as needed for muscle spasms.    . metaxalone (SKELAXIN) 800 MG tablet   2   No facility-administered medications prior to visit.     Allergies  Allergen Reactions  . Peanut-Containing Drug Products Shortness Of Breath  . Percocet [Oxycodone-Acetaminophen] Other (See Comments)    hallucinations  . Aleve [Naproxen Sodium] Swelling  . Lyrica [Pregabalin]     Suicidal     Review of Systems  Constitutional: Negative for chills, fever and malaise/fatigue.  HENT: Negative for congestion and hearing loss.   Eyes: Negative for discharge.  Respiratory: Negative for cough, sputum production and shortness of breath.   Cardiovascular: Negative for chest pain, palpitations and leg swelling.  Gastrointestinal: Negative for abdominal pain, blood in stool, constipation, diarrhea, heartburn, nausea and vomiting.  Genitourinary: Negative for dysuria, frequency, hematuria and urgency.  Musculoskeletal: Negative for back pain, falls and myalgias.  Skin: Negative for rash.  Neurological: Negative for dizziness, sensory change, loss of consciousness, weakness and headaches.  Endo/Heme/Allergies: Negative for environmental allergies. Does not bruise/bleed easily.  Psychiatric/Behavioral: Negative for depression and suicidal ideas. The patient is not nervous/anxious and does not have insomnia.        Objective:    Physical Exam  Constitutional: She is oriented to person, place, and time. She appears well-developed and well-nourished. No distress.  HENT:  Head: Normocephalic and atraumatic.  Right Ear:  External ear normal.  Left Ear: External ear normal.  Nose: Nose normal.  Mouth/Throat: Oropharynx is clear and moist.  Eyes: Pupils are equal, round, and reactive to light. Conjunctivae and EOM are normal.  Neck: Normal range of motion. Neck supple. No JVD present. Carotid bruit is not present. No thyromegaly present.  Cardiovascular: Normal rate, regular rhythm and normal heart sounds.  No murmur heard. Pulmonary/Chest: Effort normal and breath sounds normal. No respiratory distress. She has no wheezes. She has no rales. She  exhibits no tenderness.  Musculoskeletal: She exhibits no edema.  Neurological: She is alert and oriented to person, place, and time.  Psychiatric: She has a normal mood and affect. Her behavior is normal. Judgment and thought content normal.  Nursing note and vitals reviewed.  Diabetic Foot Exam - Simple   No data filed       BP 126/75 (BP Location: Left Arm, Patient Position: Sitting, Cuff Size: Large)   Pulse (!) 55   Temp 97.6 F (36.4 C) (Oral)   Resp 16   Wt 281 lb 3.2 oz (127.6 kg)   SpO2 100%   BMI 53.13 kg/m  Wt Readings from Last 3 Encounters:  05/09/18 281 lb 3.2 oz (127.6 kg)  05/09/18 280 lb 9.6 oz (127.3 kg)  12/16/17 285 lb (129.3 kg)   BP Readings from Last 3 Encounters:  05/09/18 126/75  05/09/18 108/68  12/16/17 140/85     Immunization History  Administered Date(s) Administered  . Influenza,inj,Quad PF,6+ Mos 11/17/2015  . Influenza-Unspecified 08/26/2014, 09/26/2017  . Pneumococcal Conjugate-13 11/17/2015  . Pneumococcal Polysaccharide-23 11/15/2014  . Tdap 05/18/2015  . Zoster 02/04/2014    Health Maintenance  Topic Date Due  . DEXA SCAN  12/06/2015  . OPHTHALMOLOGY EXAM  05/26/2017  . FOOT EXAM  12/10/2017  . MAMMOGRAM  04/05/2018  . URINE MICROALBUMIN  05/20/2018  . HEMOGLOBIN A1C  06/15/2018  . INFLUENZA VACCINE  06/26/2018  . PNA vac Low Risk Adult (2 of 2 - PPSV23) 11/16/2019  . COLONOSCOPY  02/04/2022  .  TETANUS/TDAP  05/17/2025  . Hepatitis C Screening  Completed    Lab Results  Component Value Date   WBC 4.8 12/16/2017   HGB 13.5 12/16/2017   HCT 41.8 12/16/2017   PLT 241.0 12/16/2017   GLUCOSE 128 (H) 12/16/2017   CHOL 172 12/16/2017   TRIG 74.0 12/16/2017   HDL 71.40 12/16/2017   LDLCALC 85 12/16/2017   ALT 13 12/16/2017   AST 17 12/16/2017   NA 137 12/16/2017   K 3.8 12/16/2017   CL 99 12/16/2017   CREATININE 0.79 12/16/2017   BUN 11 12/16/2017   CO2 33 (H) 12/16/2017   TSH 2.51 02/04/2014   HGBA1C 7.0 (H) 12/16/2017   MICROALBUR 1.3 05/20/2017    Lab Results  Component Value Date   TSH 2.51 02/04/2014   Lab Results  Component Value Date   WBC 4.8 12/16/2017   HGB 13.5 12/16/2017   HCT 41.8 12/16/2017   MCV 86.7 12/16/2017   PLT 241.0 12/16/2017   Lab Results  Component Value Date   NA 137 12/16/2017   K 3.8 12/16/2017   CO2 33 (H) 12/16/2017   GLUCOSE 128 (H) 12/16/2017   BUN 11 12/16/2017   CREATININE 0.79 12/16/2017   BILITOT 1.0 12/16/2017   ALKPHOS 116 12/16/2017   AST 17 12/16/2017   ALT 13 12/16/2017   PROT 7.0 12/16/2017   ALBUMIN 3.8 12/16/2017   CALCIUM 9.2 12/16/2017   GFR 93.35 12/16/2017   Lab Results  Component Value Date   CHOL 172 12/16/2017   Lab Results  Component Value Date   HDL 71.40 12/16/2017   Lab Results  Component Value Date   LDLCALC 85 12/16/2017   Lab Results  Component Value Date   TRIG 74.0 12/16/2017   Lab Results  Component Value Date   CHOLHDL 2 12/16/2017   Lab Results  Component Value Date   HGBA1C 7.0 (H) 12/16/2017  Assessment & Plan:   Problem List Items Addressed This Visit      Unprioritized   DM (diabetes mellitus) type II uncontrolled, periph vascular disorder (HCC)    hgba1c acceptable, minimize simple carbs. Increase exercise as tolerated. Continue current meds       Relevant Medications   atorvastatin (LIPITOR) 20 MG tablet   Essential hypertension    Well  controlled, no changes to meds. Encouraged heart healthy diet such as the DASH diet and exercise as tolerated.       Relevant Medications   atorvastatin (LIPITOR) 20 MG tablet   Hyperlipidemia    Well controlled, no changes to meds. Encouraged heart healthy diet such as the DASH diet and exercise as tolerated.       Relevant Medications   atorvastatin (LIPITOR) 20 MG tablet   Other Relevant Orders   Lipid panel   Comprehensive metabolic panel    Other Visit Diagnoses    DM (diabetes mellitus) type II, controlled, with peripheral vascular disorder (HCC)    -  Primary   Relevant Medications   atorvastatin (LIPITOR) 20 MG tablet   Other Relevant Orders   CBC with Differential/Platelet   Hemoglobin A1c   Comprehensive metabolic panel   Ambulatory referral to Podiatry   Muscle spasm       Relevant Medications   tiZANidine (ZANAFLEX) 4 MG tablet      I have discontinued Amy Mcgee's metaxalone and cyclobenzaprine. I am also having her start on tiZANidine. Additionally, I am having her maintain her ipratropium-albuterol, cholecalciferol, cetirizine, glucose blood, ACCU-CHEK SOFTCLIX LANCETS, albuterol, ALPRAZolam, amitriptyline, pantoprazole, metFORMIN, furosemide, Potassium Chloride ER, amLODipine, sertraline, spironolactone, and atorvastatin.  Meds ordered this encounter  Medications  . atorvastatin (LIPITOR) 20 MG tablet    Sig: TAKE 1 TABLET(20 MG) BY MOUTH DAILY    Dispense:  90 tablet    Refill:  0  . tiZANidine (ZANAFLEX) 4 MG tablet    Sig: Take 1 tablet (4 mg total) by mouth every 6 (six) hours as needed for muscle spasms.    Dispense:  30 tablet    Refill:  0    CMA served as scribe during this visit. History, Physical and Plan performed by medical provider. Documentation and orders reviewed and attested to.  Donato SchultzYvonne R Lowne Chase, DO

## 2018-05-10 NOTE — Assessment & Plan Note (Signed)
Well controlled, no changes to meds. Encouraged heart healthy diet such as the DASH diet and exercise as tolerated.  °

## 2018-05-10 NOTE — Assessment & Plan Note (Signed)
hgba1c acceptable, minimize simple carbs. Increase exercise as tolerated. Continue current meds 

## 2018-05-13 DIAGNOSIS — G4752 REM sleep behavior disorder: Secondary | ICD-10-CM | POA: Insufficient documentation

## 2018-05-13 NOTE — Assessment & Plan Note (Signed)
No obstructive sleep apnea on 2019 study.  This problem is resolved.

## 2018-05-13 NOTE — Assessment & Plan Note (Signed)
She is already on amitriptyline.  We considered clonazepam but episodes are not frequent and she may be better off continuing as she is doing for now.  She and her daughter both feel that oxygen helps her.  We will let her keep this for now but may not be able to renew it without objective evidence of desaturation.

## 2018-06-17 ENCOUNTER — Ambulatory Visit: Payer: Self-pay | Admitting: *Deleted

## 2018-06-17 ENCOUNTER — Encounter: Payer: Self-pay | Admitting: Family Medicine

## 2018-08-26 ENCOUNTER — Other Ambulatory Visit: Payer: Self-pay | Admitting: Family Medicine

## 2018-08-26 DIAGNOSIS — F418 Other specified anxiety disorders: Secondary | ICD-10-CM

## 2018-08-31 ENCOUNTER — Other Ambulatory Visit: Payer: Self-pay | Admitting: Family Medicine

## 2018-08-31 DIAGNOSIS — I10 Essential (primary) hypertension: Secondary | ICD-10-CM

## 2018-09-11 ENCOUNTER — Other Ambulatory Visit: Payer: Self-pay | Admitting: Family Medicine

## 2018-09-11 DIAGNOSIS — I10 Essential (primary) hypertension: Secondary | ICD-10-CM

## 2018-09-11 DIAGNOSIS — E785 Hyperlipidemia, unspecified: Secondary | ICD-10-CM

## 2018-11-14 ENCOUNTER — Ambulatory Visit: Payer: Medicare Other | Admitting: Family Medicine

## 2018-11-15 LAB — MICROALBUMIN, URINE: Microalb, Ur: 3.1

## 2018-11-27 ENCOUNTER — Ambulatory Visit: Payer: Medicare Other | Admitting: Family Medicine

## 2018-11-27 ENCOUNTER — Encounter: Payer: Self-pay | Admitting: Family Medicine

## 2018-11-27 VITALS — BP 118/66 | HR 57 | Temp 97.9°F | Resp 16 | Ht 61.0 in | Wt 294.4 lb

## 2018-11-27 DIAGNOSIS — I10 Essential (primary) hypertension: Secondary | ICD-10-CM

## 2018-11-27 DIAGNOSIS — E1151 Type 2 diabetes mellitus with diabetic peripheral angiopathy without gangrene: Secondary | ICD-10-CM

## 2018-11-27 DIAGNOSIS — E785 Hyperlipidemia, unspecified: Secondary | ICD-10-CM

## 2018-11-27 DIAGNOSIS — M159 Polyosteoarthritis, unspecified: Secondary | ICD-10-CM

## 2018-11-27 DIAGNOSIS — R252 Cramp and spasm: Secondary | ICD-10-CM

## 2018-11-27 DIAGNOSIS — IMO0002 Reserved for concepts with insufficient information to code with codable children: Secondary | ICD-10-CM

## 2018-11-27 DIAGNOSIS — E1139 Type 2 diabetes mellitus with other diabetic ophthalmic complication: Secondary | ICD-10-CM | POA: Diagnosis not present

## 2018-11-27 DIAGNOSIS — M150 Primary generalized (osteo)arthritis: Secondary | ICD-10-CM | POA: Diagnosis not present

## 2018-11-27 DIAGNOSIS — E1165 Type 2 diabetes mellitus with hyperglycemia: Secondary | ICD-10-CM | POA: Diagnosis not present

## 2018-11-27 DIAGNOSIS — E1169 Type 2 diabetes mellitus with other specified complication: Secondary | ICD-10-CM | POA: Diagnosis not present

## 2018-11-27 NOTE — Assessment & Plan Note (Signed)
Encouraged heart healthy diet, increase exercise, avoid trans fats, consider a krill oil cap daily 

## 2018-11-27 NOTE — Assessment & Plan Note (Signed)
Well controlled, no changes to meds. Encouraged heart healthy diet such as the DASH diet and exercise as tolerated.  °

## 2018-11-27 NOTE — Assessment & Plan Note (Deleted)
Encouraged heart healthy diet, increase exercise, avoid trans fats, consider a krill oil cap daily 

## 2018-11-27 NOTE — Patient Instructions (Signed)
Carbohydrate Counting for Diabetes Mellitus, Adult  Carbohydrate counting is a method of keeping track of how many carbohydrates you eat. Eating carbohydrates naturally increases the amount of sugar (glucose) in the blood. Counting how many carbohydrates you eat helps keep your blood glucose within normal limits, which helps you manage your diabetes (diabetes mellitus). It is important to know how many carbohydrates you can safely have in each meal. This is different for every person. A diet and nutrition specialist (registered dietitian) can help you make a meal plan and calculate how many carbohydrates you should have at each meal and snack. Carbohydrates are found in the following foods:  Grains, such as breads and cereals.  Dried beans and soy products.  Starchy vegetables, such as potatoes, peas, and corn.  Fruit and fruit juices.  Milk and yogurt.  Sweets and snack foods, such as cake, cookies, candy, chips, and soft drinks. How do I count carbohydrates? There are two ways to count carbohydrates in food. You can use either of the methods or a combination of both. Reading "Nutrition Facts" on packaged food The "Nutrition Facts" list is included on the labels of almost all packaged foods and beverages in the [place] It includes:  The serving size.  Information about nutrients in each serving, including the grams (g) of carbohydrate per serving. To use the "Nutrition Facts":  Decide how many servings you will have.  Multiply the number of servings by the number of carbohydrates per serving.  The resulting number is the total amount of carbohydrates that you will be having. Learning standard serving sizes of other foods When you eat carbohydrate foods that are not packaged or do not include "Nutrition Facts" on the label, you need to measure the servings in order to count the amount of carbohydrates:  Measure the foods that you will eat with a food scale or measuring cup, if needed.   Decide how many standard-size servings you will eat.  Multiply the number of servings by 15. Most carbohydrate-rich foods have about 15 g of carbohydrates per serving. ? For example, if you eat 8 oz (170 g) of strawberries, you will have eaten 2 servings and 30 g of carbohydrates (2 servings x 15 g = 30 g).  For foods that have more than one food mixed, such as soups and casseroles, you must count the carbohydrates in each food that is included. The following list contains standard serving sizes of common carbohydrate-rich foods. Each of these servings has about 15 g of carbohydrates:   hamburger bun or  English muffin.   oz (15 mL) syrup.   oz (14 g) jelly.  1 slice of bread.  1 six-inch tortilla.  3 oz (85 g) cooked rice or pasta.  4 oz (113 g) cooked dried beans.  4 oz (113 g) starchy vegetable, such as peas, corn, or potatoes.  4 oz (113 g) hot cereal.  4 oz (113 g) mashed potatoes or  of a large baked potato.  4 oz (113 g) canned or frozen fruit.  4 oz (120 mL) fruit juice.  4-6 crackers.  6 chicken nuggets.  6 oz (170 g) unsweetened dry cereal.  6 oz (170 g) plain fat-free yogurt or yogurt sweetened with artificial sweeteners.  8 oz (240 mL) milk.  8 oz (170 g) fresh fruit or one small piece of fruit.  24 oz (680 g) popped popcorn. Example of carbohydrate counting Sample meal  3 oz (85 g) chicken breast.  6 oz (170 g)   brown rice.  4 oz (113 g) corn.  8 oz (240 mL) milk.  8 oz (170 g) strawberries with sugar-free whipped topping. Carbohydrate calculation 1. Identify the foods that contain carbohydrates: ? Rice. ? Corn. ? Milk. ? Strawberries. 2. Calculate how many servings you have of each food: ? 2 servings rice. ? 1 serving corn. ? 1 serving milk. ? 1 serving strawberries. 3. Multiply each number of servings by 15 g: ? 2 servings rice x 15 g = 30 g. ? 1 serving corn x 15 g = 15 g. ? 1 serving milk x 15 g = 15 g. ? 1 serving  strawberries x 15 g = 15 g. 4. Add together all of the amounts to find the total grams of carbohydrates eaten: ? 30 g + 15 g + 15 g + 15 g = 75 g of carbohydrates total. Summary  Carbohydrate counting is a method of keeping track of how many carbohydrates you eat.  Eating carbohydrates naturally increases the amount of sugar (glucose) in the blood.  Counting how many carbohydrates you eat helps keep your blood glucose within normal limits, which helps you manage your diabetes.  A diet and nutrition specialist (registered dietitian) can help you make a meal plan and calculate how many carbohydrates you should have at each meal and snack. This information is not intended to replace advice given to you by your health care provider. Make sure you discuss any questions you have with your health care provider. Document Released: 11/12/2005 Document Revised: 05/22/2017 Document Reviewed: 04/25/2016 Elsevier Interactive Patient Education  2019 [employer]  

## 2018-11-27 NOTE — Progress Notes (Signed)
Patient ID: Amy Mcgee, female    DOB: July 17, 1951  Age: 68 y.o. MRN: [id number]    Subjective:  Subjective  HPI Amy CoverSarah Mcgee presents for f/u dm , cholesterol and bp -- pt has been seeing a pcp in [place]-- labs, pap , bmd , mammo done there. Pt states her a1c was 7.2 with pcp in [place].   We do not have her labs   HYPERTENSION   Blood pressure range-good per pt   Chest pain- no      Dyspnea- no Lightheadedness- no   Edema- no  Other side effects - no   Medication compliance: good Low salt diet- yes    DIABETES    Blood Sugar ranges-good per pt   Polyuria- no New Visual problems- no  Hypoglycemic symptoms- no Other side effects-no Medication compliance - good Last eye exam- done per pt  Foot exam- today   HYPERLIPIDEMIA  Medication compliance- good RUQ pain- no  Muscle aches- no Other side effects-no    Review of Systems  Constitutional: Negative for appetite change, chills, diaphoresis, fatigue, fever and unexpected weight change.  HENT: Negative for congestion and hearing loss.   Eyes: Negative for pain, discharge, redness and visual disturbance.  Respiratory: Negative for cough, chest tightness, shortness of breath and wheezing.   Cardiovascular: Negative for chest pain, palpitations and leg swelling.  Gastrointestinal: Negative for abdominal pain, blood in stool, constipation, diarrhea, nausea and vomiting.  Endocrine: Negative for cold intolerance, heat intolerance, polydipsia, polyphagia and polyuria.  Genitourinary: Negative for difficulty urinating, dysuria, frequency, hematuria and urgency.  Musculoskeletal: Negative for back pain and myalgias.  Skin: Negative for rash.  Allergic/Immunologic: Negative for environmental allergies.  Neurological: Negative for dizziness, weakness, light-headedness, numbness and headaches.  Hematological: Does not bruise/bleed easily.  Psychiatric/Behavioral: Negative for suicidal ideas. The patient is not nervous/anxious.      History Past Medical History:  Diagnosis Date  . Diabetes mellitus without complication (HCC)   . Hypercholesterolemia   . Hypertension   . Sinusitis     She has a past surgical history that includes Spine surgery (2002); Joint replacement; Abdominal hysterectomy (1999); and Carpal tunnel release (Left, 04/2016).   Her family history includes Allergies in her brother, brother, daughter, maternal aunt, and mother; Stroke in her mother.She reports that she has never smoked. She has never used smokeless tobacco. She reports that she does not drink alcohol or use drugs.  Current Outpatient Medications on File Prior to Visit  Medication Sig Dispense Refill  . ACCU-CHEK SOFTCLIX LANCETS lancets TEST BLOOD SUGAR ONCE DAILY 100 each 3  . albuterol (PROAIR HFA) 108 (90 Base) MCG/ACT inhaler Inhale 2 puffs into the lungs every 6 (six) hours as needed for wheezing or shortness of breath. 1 Inhaler 11  . ALPRAZolam (XANAX) 0.25 MG tablet TAKE 1 TABLET BY MOUTH THREE TIMES DAILY AS NEEDED FOR ANXIETY OR SLEEP 30 tablet 5  . amitriptyline (ELAVIL) 10 MG tablet 1-3 at bedtime as directed for nightmares 31 tablet 12  . amLODipine (NORVASC) 5 MG tablet TAKE 1 TABLET(5 MG) BY MOUTH DAILY 90 tablet 1  . atorvastatin (LIPITOR) 20 MG tablet Take 1 tablet (20 mg total) by mouth daily. 90 tablet 0  . cetirizine (ZYRTEC) 10 MG tablet Take 10 mg by mouth daily.    . cholecalciferol (VITAMIN D) 1000 UNITS tablet Take 1,000 Units by mouth daily.    . furosemide (LASIX) 40 MG tablet Take 1 tablet (40 mg total) by mouth as needed. 90  tablet 1  . glucose blood (ACCU-CHEK AVIVA PLUS) test strip Check blood sugar once daily 100 each 3  . ipratropium-albuterol (DUONEB) 0.5-2.5 (3) MG/3ML SOLN Take 3 mLs by nebulization every 6 (six) hours as needed.    . metFORMIN (GLUCOPHAGE) 500 MG tablet TAKE 1 TABLET BY MOUTH TWICE DAILY WITH A MEAL 180 tablet 1  . pantoprazole (PROTONIX) 40 MG tablet Take 40 mg by mouth daily.     . Potassium Chloride ER 20 MEQ TBCR 1 po qd 90 tablet 3  . sertraline (ZOLOFT) 100 MG tablet TAKE 1 TABLET(100 MG) BY MOUTH DAILY 90 tablet 1  . spironolactone (ALDACTONE) 50 MG tablet Take 1 tablet (50 mg total) by mouth daily. 90 tablet 0  . tiZANidine (ZANAFLEX) 4 MG tablet Take 1 tablet (4 mg total) by mouth every 6 (six) hours as needed for muscle spasms. 30 tablet 0  . traMADol (ULTRAM) 50 MG tablet Take 50-100 mg by mouth every 12 (twelve) hours as needed.     No current facility-administered medications on file prior to visit.      Objective:  Objective  Physical Exam Vitals signs and nursing note reviewed.  Constitutional:      General: She is not in acute distress.    Appearance: She is well-developed.  HENT:     Head: Normocephalic and atraumatic.     Right Ear: External ear normal.     Left Ear: External ear normal.     Nose: Nose normal.  Eyes:     Conjunctiva/sclera: Conjunctivae normal.     Pupils: Pupils are equal, round, and reactive to light.  Neck:     Musculoskeletal: Normal range of motion and neck supple.     Thyroid: No thyromegaly.     Vascular: No carotid bruit or JVD.  Cardiovascular:     Rate and Rhythm: Normal rate and regular rhythm.     Heart sounds: Normal heart sounds. No murmur.  Pulmonary:     Effort: Pulmonary effort is normal. No respiratory distress.     Breath sounds: Normal breath sounds. No wheezing or rales.  Chest:     Chest wall: No tenderness.  Neurological:     Mental Status: She is alert and oriented to person, place, and time.  Psychiatric:        Behavior: Behavior normal.        Thought Content: Thought content normal.        Judgment: Judgment normal.    BP 118/66 (BP Location: Left Arm, Cuff Size: Large)   Pulse (!) 57   Temp 97.9 F (36.6 C) (Oral)   Resp 16   Ht 5\' 1"  (1.549 m)   Wt 294 lb 6.4 oz (133.5 kg)   SpO2 99%   BMI 55.63 kg/m  Wt Readings from Last 3 Encounters:  11/27/18 294 lb 6.4 oz (133.5 kg)   05/09/18 281 lb 3.2 oz (127.6 kg)  05/09/18 280 lb 9.6 oz (127.3 kg)     Lab Results  Component Value Date   WBC 4.8 12/16/2017   HGB 13.5 12/16/2017   HCT 41.8 12/16/2017   PLT 241.0 12/16/2017   GLUCOSE 128 (H) 12/16/2017   CHOL 172 12/16/2017   TRIG 74.0 12/16/2017   HDL 71.40 12/16/2017   LDLCALC 85 12/16/2017   ALT 13 12/16/2017   AST 17 12/16/2017   NA 137 12/16/2017   K 3.8 12/16/2017   CL 99 12/16/2017   CREATININE 0.79 12/16/2017   BUN 11  12/16/2017   CO2 33 (H) 12/16/2017   TSH 2.51 02/04/2014   HGBA1C 7.0 (H) 12/16/2017   MICROALBUR 1.3 05/20/2017    Dg Chest 2 View  Result Date: 12/10/2016 CLINICAL DATA:  Shortness of breath.  Hypertension. EXAM: CHEST  2 VIEW COMPARISON:  May 18, 2014 FINDINGS: There is no edema or consolidation. Heart size and pulmonary vascularity are normal. No adenopathy. No bone lesions. IMPRESSION: No edema or consolidation. Electronically Signed   By: Bretta Bang III M.D.   On: 12/10/2016 14:24     Assessment & Plan:  Plan  I am having Ahleah Berardino maintain her ipratropium-albuterol, cholecalciferol, cetirizine, glucose blood, ACCU-CHEK SOFTCLIX LANCETS, albuterol, ALPRAZolam, amitriptyline, pantoprazole, metFORMIN, furosemide, Potassium Chloride ER, tiZANidine, sertraline, amLODipine, spironolactone, atorvastatin, and traMADol.  No orders of the defined types were placed in this encounter.   Problem List Items Addressed This Visit      Unprioritized   DM (diabetes mellitus) type II uncontrolled, periph vascular disorder (HCC)    hgba1c to be checked, minimize simple carbs. Increase exercise as tolerated. Continue current meds       Essential hypertension    Well controlled, no changes to meds. Encouraged heart healthy diet such as the DASH diet and exercise as tolerated.       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  Donato Schultz, DO

## 2018-11-27 NOTE — Assessment & Plan Note (Signed)
hgba1c to be checked, minimize simple carbs. Increase exercise as tolerated. Continue current meds  

## 2018-11-28 LAB — COMPREHENSIVE METABOLIC PANEL
ALT: 8 U/L (ref 0–35)
AST: 17 U/L (ref 0–37)
Albumin: 3.8 g/dL (ref 3.5–5.2)
Alkaline Phosphatase: 115 U/L (ref 39–117)
BUN: 12 mg/dL (ref 6–23)
CHLORIDE: 103 meq/L (ref 96–112)
CO2: 31 meq/L (ref 19–32)
CREATININE: 0.84 mg/dL (ref 0.40–1.20)
Calcium: 9.5 mg/dL (ref 8.4–10.5)
GFR: 86.72 mL/min (ref >60.00)
Glucose, Bld: 86 mg/dL (ref 70–99)
POTASSIUM: 5 meq/L (ref 3.5–5.1)
Sodium: 141 mEq/L (ref 135–145)
Total Bilirubin: 0.8 mg/dL (ref 0.2–1.2)
Total Protein: 6.6 g/dL (ref 6.0–8.3)

## 2018-11-28 LAB — LIPID PANEL
CHOL/HDL RATIO: 2
Cholesterol: 165 mg/dL (ref 0–200)
HDL: 68.8 mg/dL (ref >39.00)
LDL Cholesterol: 84 mg/dL (ref 0–99)
NONHDL: 95.86
Triglycerides: 60 mg/dL (ref 0.0–149.0)
VLDL: 12 mg/dL (ref 0.0–40.0)

## 2018-11-28 LAB — MICROALBUMIN / CREATININE URINE RATIO
CREATININE, U: 155.1 mg/dL
Microalb Creat Ratio: 0.6 mg/g (ref 0.0–30.0)
Microalb, Ur: 0.9 mg/dL (ref 0.0–1.9)

## 2018-11-28 LAB — HEMOGLOBIN A1C: Hgb A1c MFr Bld: 6.7 % — ABNORMAL HIGH (ref 4.6–6.5)

## 2018-12-10 ENCOUNTER — Telehealth: Payer: Self-pay | Admitting: *Deleted

## 2018-12-10 ENCOUNTER — Other Ambulatory Visit: Payer: Self-pay | Admitting: Family Medicine

## 2018-12-10 DIAGNOSIS — I10 Essential (primary) hypertension: Secondary | ICD-10-CM

## 2018-12-10 DIAGNOSIS — E785 Hyperlipidemia, unspecified: Secondary | ICD-10-CM

## 2018-12-10 NOTE — Telephone Encounter (Signed)
Received Lab Report results from [facility] via [employer]; forwarded to provider/SLS 01/15

## 2018-12-22 ENCOUNTER — Encounter: Payer: Self-pay | Admitting: Family Medicine

## 2018-12-29 ENCOUNTER — Telehealth: Payer: Self-pay | Admitting: *Deleted

## 2018-12-29 NOTE — Telephone Encounter (Signed)
Received Physician Orders from [facility]; forwarded to provider/SLS 02/03

## 2019-01-30 ENCOUNTER — Telehealth: Payer: Self-pay | Admitting: *Deleted

## 2019-01-30 NOTE — Telephone Encounter (Signed)
Received Physician Orders from [facility]; forwarded to provider/SLS 03/06

## 2019-02-25 ENCOUNTER — Other Ambulatory Visit: Payer: Self-pay | Admitting: Family Medicine

## 2019-02-25 DIAGNOSIS — E1151 Type 2 diabetes mellitus with diabetic peripheral angiopathy without gangrene: Secondary | ICD-10-CM

## 2019-02-25 MED ORDER — METFORMIN HCL 500 MG PO TABS: 500.0000 mg | ORAL_TABLET | Freq: Two times a day (BID) | ORAL | 1 refills | Status: AC

## 2019-02-25 NOTE — Telephone Encounter (Signed)
Last filled 09/30/17

## 2019-03-30 ENCOUNTER — Telehealth: Payer: Self-pay | Admitting: *Deleted

## 2019-03-30 NOTE — Telephone Encounter (Signed)
Patient due for follow up.  Left message on machine to call back to set up virtual visit for follow up.

## 2019-09-16 ENCOUNTER — Other Ambulatory Visit: Payer: Self-pay | Admitting: Family Medicine

## 2019-09-16 DIAGNOSIS — F418 Other specified anxiety disorders: Secondary | ICD-10-CM

## 2019-11-03 ENCOUNTER — Telehealth: Payer: Self-pay | Admitting: *Deleted

## 2019-11-03 NOTE — Telephone Encounter (Signed)
Received fax from [facility] in [place] for sertraline refill. Message was left for pt to call and schedule follow up for further refills at last refill on 09/17/19. Current request denied until pt completes follow up. Mychart message sent to pt to schedule appt.

## 2019-12-25 ENCOUNTER — Other Ambulatory Visit: Payer: Self-pay

## 2019-12-28 ENCOUNTER — Ambulatory Visit: Payer: Medicare Other | Admitting: Family Medicine

## 2020-01-11 ENCOUNTER — Other Ambulatory Visit: Payer: Self-pay | Admitting: Family Medicine

## 2020-01-11 DIAGNOSIS — E785 Hyperlipidemia, unspecified: Secondary | ICD-10-CM

## 2020-01-11 DIAGNOSIS — I10 Essential (primary) hypertension: Secondary | ICD-10-CM
